# Patient Record
Sex: Female | Born: 1980 | Race: White | Hispanic: No | Smoking: Current every day smoker
Health system: Southern US, Community
[De-identification: ages and names within clinical notes are randomized; demographics above are authoritative.]

## PROBLEM LIST (undated history)

## (undated) DIAGNOSIS — Z8619 Personal history of other infectious and parasitic diseases: Secondary | ICD-10-CM

## (undated) DIAGNOSIS — N189 Chronic kidney disease, unspecified: Secondary | ICD-10-CM

## (undated) DIAGNOSIS — IMO0002 Reserved for concepts with insufficient information to code with codable children: Secondary | ICD-10-CM

## (undated) DIAGNOSIS — K589 Irritable bowel syndrome without diarrhea: Secondary | ICD-10-CM

## (undated) HISTORY — DX: Irritable bowel syndrome, unspecified: K58.9

## (undated) HISTORY — DX: Reserved for concepts with insufficient information to code with codable children: IMO0002

## (undated) HISTORY — PX: KIDNEY SURGERY: SHX687

## (undated) HISTORY — DX: Chronic kidney disease, unspecified: N18.9

## (undated) HISTORY — DX: Personal history of other infectious and parasitic diseases: Z86.19

---

## 1999-09-09 ENCOUNTER — Ambulatory Visit (HOSPITAL_COMMUNITY): Admission: RE | Admit: 1999-09-09 | Discharge: 1999-09-09 | Payer: Self-pay | Admitting: Obstetrics and Gynecology

## 2000-07-27 ENCOUNTER — Ambulatory Visit (HOSPITAL_COMMUNITY): Admission: RE | Admit: 2000-07-27 | Discharge: 2000-07-27 | Payer: Self-pay | Admitting: Urology

## 2004-05-06 ENCOUNTER — Inpatient Hospital Stay (HOSPITAL_COMMUNITY): Admission: AD | Admit: 2004-05-06 | Discharge: 2004-05-06 | Payer: Self-pay | Admitting: Obstetrics and Gynecology

## 2004-07-09 ENCOUNTER — Ambulatory Visit (HOSPITAL_COMMUNITY): Admission: RE | Admit: 2004-07-09 | Discharge: 2004-07-09 | Payer: Self-pay | Admitting: Obstetrics & Gynecology

## 2004-09-19 ENCOUNTER — Inpatient Hospital Stay (HOSPITAL_COMMUNITY): Admission: AD | Admit: 2004-09-19 | Discharge: 2004-09-19 | Payer: Self-pay | Admitting: Obstetrics and Gynecology

## 2004-09-20 ENCOUNTER — Inpatient Hospital Stay (HOSPITAL_COMMUNITY): Admission: AD | Admit: 2004-09-20 | Discharge: 2004-09-22 | Payer: Self-pay | Admitting: Obstetrics and Gynecology

## 2004-10-22 ENCOUNTER — Other Ambulatory Visit: Admission: RE | Admit: 2004-10-22 | Discharge: 2004-10-22 | Payer: Self-pay | Admitting: Obstetrics and Gynecology
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
## 2006-02-03 ENCOUNTER — Other Ambulatory Visit: Admission: RE | Admit: 2006-02-03 | Discharge: 2006-02-03 | Payer: Self-pay | Admitting: Obstetrics & Gynecology

## 2008-05-07 ENCOUNTER — Encounter: Admission: RE | Admit: 2008-05-07 | Discharge: 2008-05-07 | Payer: Self-pay | Admitting: Obstetrics & Gynecology

## 2008-12-05 HISTORY — PX: OTHER SURGICAL HISTORY: SHX169

## 2009-01-26 ENCOUNTER — Encounter: Admission: RE | Admit: 2009-01-26 | Discharge: 2009-01-26 | Payer: Self-pay | Admitting: Obstetrics and Gynecology

## 2010-12-26 ENCOUNTER — Encounter: Payer: Self-pay | Admitting: Obstetrics & Gynecology

## 2011-04-22 NOTE — Op Note (Signed)
[facility]  Patient:    Stephanie Petty, Stephanie Petty                   MRN: [id number] Proc. Date: 07/27/00 Adm. Date:  [id number] Disc. Date: [id number] Attending:  Laqueta Jean                           Operative Report  PREOPERATIVE DIAGNOSIS:  Left lower ureteral calculus.  POSTOPERATIVE DIAGNOSIS:  Left lower ureteral calculus.  OPERATION PERFORMED:  Cystourethroscopy, left retrograde pyelogram, balloon dilation of lower left ureter, ureteroscopy with basket extraction of left ureteral calculus, left double-J catheter.  SURGEON:  Sigmund I. Patsi Sears, M.D.  ANESTHESIA:  General.  PREPARATION:  After appropriate preanesthesia, the patient was brought to the operating room and placed on the operating table in a dorsal supine position where general anesthesia was introduced.  She was then placed in dorsal lithotomy position where the pubis was prepped with Betadine solution and draped in the usual fashion.  DESCRIPTION OF PROCEDURE:  Cystourethroscopy was accomplished and left retrograde pyelogram was accomplished, which showed a left lower ureteral calculus, imbedded in the ureter.  Balloon dilation was accomplished of the lower 4 cm of ureter, and ureteroscopy was accomplished with identification of fractured stone in the lower left ureter.  The stone was impacted and removed from the side wall of the ureter.  Following multiple ureteroscopies, Xylocaine jelly was placed in the ureter and double-J catheter was passed into the renal pelvis and coiled in the renal pelvis and the bladder.  The patient was given IV Toradol, awakened and taken to the recovery room in excellent condition. DD:  07/27/00 TD:  07/29/00 Job: [id number]

## 2011-06-29 LAB — ABO/RH

## 2011-06-29 LAB — ANTIBODY SCREEN: Antibody Screen: NEGATIVE

## 2011-06-29 LAB — HEPATITIS B SURFACE ANTIGEN: Hepatitis B Surface Ag: NEGATIVE

## 2011-06-29 LAB — HIV ANTIBODY (ROUTINE TESTING W REFLEX): HIV: NONREACTIVE

## 2011-06-29 LAB — GC/CHLAMYDIA PROBE AMP, GENITAL
Chlamydia: NEGATIVE
Gonorrhea: NEGATIVE

## 2011-12-06 NOTE — L&D Delivery Note (Signed)
Delivery Note  SVD viable female Apgars 8,9 over intact perineum.  Placenta delivered spontaneously intact with 3VC. good support and hemostasis noted and R/V exam confirms.  PH art was snet.  [place] cord blood was not done.  Mother and baby were doing well.  EBL 300  Candice Camp, MD

## 2012-01-18 LAB — STREP B DNA PROBE: GBS: POSITIVE

## 2012-02-08 ENCOUNTER — Encounter (HOSPITAL_COMMUNITY): Payer: Self-pay | Admitting: *Deleted

## 2012-02-08 ENCOUNTER — Telehealth (HOSPITAL_COMMUNITY): Payer: Self-pay | Admitting: *Deleted

## 2012-02-08 NOTE — Telephone Encounter (Signed)
Preadmission screen  

## 2012-02-10 ENCOUNTER — Inpatient Hospital Stay (HOSPITAL_COMMUNITY): Payer: BC Managed Care – PPO | Admitting: Anesthesiology

## 2012-02-10 ENCOUNTER — Inpatient Hospital Stay (HOSPITAL_COMMUNITY)
Admission: RE | Admit: 2012-02-10 | Discharge: 2012-02-12 | DRG: 373 | Disposition: A | Discharge status: Home / Self Care | Payer: BC Managed Care – PPO | Source: Ambulatory Visit | Attending: Obstetrics and Gynecology | Admitting: Obstetrics and Gynecology

## 2012-02-10 ENCOUNTER — Encounter (HOSPITAL_COMMUNITY): Payer: Self-pay | Admitting: Anesthesiology

## 2012-02-10 ENCOUNTER — Encounter (HOSPITAL_COMMUNITY): Payer: Self-pay

## 2012-02-10 DIAGNOSIS — Z22330 Carrier of Group B streptococcus: Secondary | ICD-10-CM

## 2012-02-10 DIAGNOSIS — O99892 Other specified diseases and conditions complicating childbirth: Principal | ICD-10-CM | POA: Diagnosis present

## 2012-02-10 LAB — CBC
Hemoglobin: 11.5 g/dL — ABNORMAL LOW (ref 12.0–15.0)
MCH: 27.2 pg (ref 26.0–34.0)
MCV: 84.6 fL (ref 78.0–100.0)
RBC: 4.23 MIL/uL (ref 3.87–5.11)

## 2012-02-10 LAB — RPR: RPR Ser Ql: NONREACTIVE

## 2012-02-10 MED ORDER — SENNOSIDES-DOCUSATE SODIUM 8.6-50 MG PO TABS
2.0000 | ORAL_TABLET | Freq: Every day | ORAL | Status: DC
  Administered 2012-02-11: 2 via ORAL

## 2012-02-10 MED ORDER — PHENYLEPHRINE 40 MCG/ML (10ML) SYRINGE FOR IV PUSH (FOR BLOOD PRESSURE SUPPORT): 80.0000 ug | PREFILLED_SYRINGE | INTRAVENOUS | Status: DC | PRN

## 2012-02-10 MED ORDER — LACTATED RINGERS IV SOLN
500.0000 mL | INTRAVENOUS | Status: DC | PRN
  Administered 2012-02-10: 1000 mL via INTRAVENOUS

## 2012-02-10 MED ORDER — FLEET ENEMA 7-19 GM/118ML RE ENEM: 1.0000 | ENEMA | RECTAL | Status: DC | PRN

## 2012-02-10 MED ORDER — IBUPROFEN 600 MG PO TABS: 600.0000 mg | ORAL_TABLET | Freq: Four times a day (QID) | ORAL | Status: DC | PRN

## 2012-02-10 MED ORDER — IBUPROFEN 600 MG PO TABS
600.0000 mg | ORAL_TABLET | Freq: Four times a day (QID) | ORAL | Status: DC
  Administered 2012-02-11 – 2012-02-12 (×4): 600 mg via ORAL
  Filled 2012-02-10 (×5): qty 1

## 2012-02-10 MED ORDER — ZOLPIDEM TARTRATE 10 MG PO TABS: 10.0000 mg | ORAL_TABLET | Freq: Every evening | ORAL | Status: DC | PRN

## 2012-02-10 MED ORDER — LACTATED RINGERS IV SOLN: 500.0000 mL | Freq: Once | INTRAVENOUS | Status: DC

## 2012-02-10 MED ORDER — FENTANYL 2.5 MCG/ML BUPIVACAINE 1/10 % EPIDURAL INFUSION (WH - ANES)
INTRAMUSCULAR | Status: DC | PRN
  Administered 2012-02-10: 14 mL/h via EPIDURAL

## 2012-02-10 MED ORDER — EPHEDRINE 5 MG/ML INJ: 10.0000 mg | INTRAVENOUS | Status: DC | PRN

## 2012-02-10 MED ORDER — MEDROXYPROGESTERONE ACETATE 150 MG/ML IM SUSP: 150.0000 mg | INTRAMUSCULAR | Status: DC | PRN

## 2012-02-10 MED ORDER — OXYTOCIN 20 UNITS IN LACTATED RINGERS INFUSION - SIMPLE
125.0000 mL/h | Freq: Once | INTRAVENOUS | Status: AC
  Administered 2012-02-10: 125 mL/h via INTRAVENOUS

## 2012-02-10 MED ORDER — OXYTOCIN BOLUS FROM INFUSION
500.0000 mL | Freq: Once | INTRAVENOUS | Status: DC
  Filled 2012-02-10: qty 500

## 2012-02-10 MED ORDER — CITRIC ACID-SODIUM CITRATE 334-500 MG/5ML PO SOLN: 30.0000 mL | ORAL | Status: DC | PRN

## 2012-02-10 MED ORDER — ONDANSETRON HCL 4 MG/2ML IJ SOLN: 4.0000 mg | INTRAMUSCULAR | Status: DC | PRN

## 2012-02-10 MED ORDER — DIPHENHYDRAMINE HCL 50 MG/ML IJ SOLN: 12.5000 mg | INTRAMUSCULAR | Status: DC | PRN

## 2012-02-10 MED ORDER — OXYTOCIN 20 UNITS IN LACTATED RINGERS INFUSION - SIMPLE
1.0000 m[IU]/min | INTRAVENOUS | Status: DC
  Administered 2012-02-10: 12 m[IU]/min via INTRAVENOUS
  Administered 2012-02-10: 1 m[IU]/min via INTRAVENOUS
  Filled 2012-02-10: qty 1000

## 2012-02-10 MED ORDER — ACETAMINOPHEN 325 MG PO TABS: 650.0000 mg | ORAL_TABLET | ORAL | Status: DC | PRN

## 2012-02-10 MED ORDER — FENTANYL 2.5 MCG/ML BUPIVACAINE 1/10 % EPIDURAL INFUSION (WH - ANES)
14.0000 mL/h | INTRAMUSCULAR | Status: DC
  Administered 2012-02-10: 14 mL/h via EPIDURAL
  Filled 2012-02-10 (×2): qty 60

## 2012-02-10 MED ORDER — TETANUS-DIPHTH-ACELL PERTUSSIS 5-2.5-18.5 LF-MCG/0.5 IM SUSP: 0.5000 mL | Freq: Once | INTRAMUSCULAR | Status: DC

## 2012-02-10 MED ORDER — OXYCODONE-ACETAMINOPHEN 5-325 MG PO TABS: 1.0000 | ORAL_TABLET | ORAL | Status: DC | PRN

## 2012-02-10 MED ORDER — TERBUTALINE SULFATE 1 MG/ML IJ SOLN: 0.2500 mg | Freq: Once | INTRAMUSCULAR | Status: DC | PRN

## 2012-02-10 MED ORDER — DIPHENHYDRAMINE HCL 25 MG PO CAPS: 25.0000 mg | ORAL_CAPSULE | Freq: Four times a day (QID) | ORAL | Status: DC | PRN

## 2012-02-10 MED ORDER — BUTORPHANOL TARTRATE 2 MG/ML IJ SOLN
1.0000 mg | INTRAMUSCULAR | Status: DC | PRN
  Filled 2012-02-10: qty 1

## 2012-02-10 MED ORDER — ONDANSETRON HCL 4 MG PO TABS: 4.0000 mg | ORAL_TABLET | ORAL | Status: DC | PRN

## 2012-02-10 MED ORDER — SODIUM CHLORIDE 0.9 % IV SOLN
2.0000 g | Freq: Four times a day (QID) | INTRAVENOUS | Status: DC
  Administered 2012-02-10 (×4): 2 g via INTRAVENOUS
  Filled 2012-02-10 (×4): qty 2000

## 2012-02-10 MED ORDER — BUTORPHANOL TARTRATE 2 MG/ML IJ SOLN
1.0000 mg | INTRAMUSCULAR | Status: DC | PRN
  Administered 2012-02-10: 1 mg via INTRAVENOUS

## 2012-02-10 MED ORDER — PHENYLEPHRINE 40 MCG/ML (10ML) SYRINGE FOR IV PUSH (FOR BLOOD PRESSURE SUPPORT)
80.0000 ug | PREFILLED_SYRINGE | INTRAVENOUS | Status: DC | PRN
  Filled 2012-02-10: qty 5

## 2012-02-10 MED ORDER — DIBUCAINE 1 % RE OINT: 1.0000 "application " | TOPICAL_OINTMENT | RECTAL | Status: DC | PRN

## 2012-02-10 MED ORDER — EPHEDRINE 5 MG/ML INJ
10.0000 mg | INTRAVENOUS | Status: DC | PRN
  Filled 2012-02-10: qty 4

## 2012-02-10 MED ORDER — FENTANYL 2.5 MCG/ML BUPIVACAINE 1/10 % EPIDURAL INFUSION (WH - ANES): 14.0000 mL/h | INTRAMUSCULAR | Status: DC

## 2012-02-10 MED ORDER — ZOLPIDEM TARTRATE 5 MG PO TABS: 5.0000 mg | ORAL_TABLET | Freq: Every evening | ORAL | Status: DC | PRN

## 2012-02-10 MED ORDER — SODIUM BICARBONATE 8.4 % IV SOLN
INTRAVENOUS | Status: DC | PRN
  Administered 2012-02-10: 4 mL via EPIDURAL

## 2012-02-10 MED ORDER — LIDOCAINE HCL (PF) 1 % IJ SOLN
30.0000 mL | INTRAMUSCULAR | Status: DC | PRN
  Filled 2012-02-10: qty 30

## 2012-02-10 MED ORDER — ONDANSETRON HCL 4 MG/2ML IJ SOLN: 4.0000 mg | Freq: Four times a day (QID) | INTRAMUSCULAR | Status: DC | PRN

## 2012-02-10 MED ORDER — PRENATAL MULTIVITAMIN CH: 1.0000 | ORAL_TABLET | Freq: Every day | ORAL | Status: DC

## 2012-02-10 MED ORDER — PROMETHAZINE HCL 25 MG/ML IJ SOLN: 12.5000 mg | Freq: Four times a day (QID) | INTRAMUSCULAR | Status: DC | PRN

## 2012-02-10 MED ORDER — LANOLIN HYDROUS EX OINT: TOPICAL_OINTMENT | CUTANEOUS | Status: DC | PRN

## 2012-02-10 MED ORDER — MEASLES, MUMPS & RUBELLA VAC ~~LOC~~ INJ: 0.5000 mL | INJECTION | Freq: Once | SUBCUTANEOUS | Status: DC

## 2012-02-10 MED ORDER — BENZOCAINE-MENTHOL 20-0.5 % EX AERO: 1.0000 "application " | INHALATION_SPRAY | CUTANEOUS | Status: DC | PRN

## 2012-02-10 MED ORDER — PRENATAL MULTIVITAMIN CH
1.0000 | ORAL_TABLET | Freq: Every day | ORAL | Status: DC
  Administered 2012-02-11 – 2012-02-12 (×2): 1 via ORAL
  Filled 2012-02-10 (×2): qty 1

## 2012-02-10 MED ORDER — LACTATED RINGERS IV SOLN
INTRAVENOUS | Status: DC
  Administered 2012-02-10 (×2): 999 mL via INTRAVENOUS

## 2012-02-10 MED ORDER — WITCH HAZEL-GLYCERIN EX PADS: 1.0000 "application " | MEDICATED_PAD | CUTANEOUS | Status: DC | PRN

## 2012-02-10 MED ORDER — SIMETHICONE 80 MG PO CHEW: 80.0000 mg | CHEWABLE_TABLET | ORAL | Status: DC | PRN

## 2012-02-10 NOTE — Anesthesia Procedure Notes (Signed)

## 2012-02-10 NOTE — Progress Notes (Signed)
Pt up to br using steady, pt pericare done and taught, gown changed,  Pt ambulates to wc.  Pt transferred to [facility].  Report given to mother baby Public affairs consultant

## 2012-02-10 NOTE — Anesthesia Preprocedure Evaluation (Signed)
Anesthesia Evaluation  Patient identified by MRN, date of birth, ID band Patient awake    Reviewed: Allergy & Precautions, H&P , Patient's Chart, lab work & pertinent test results  Airway Mallampati: II  TM Distance: >3 FB Neck ROM: full    Dental  (+) Teeth Intact   Pulmonary  breath sounds clear to auscultation        Cardiovascular Rhythm:regular Rate:Normal     Neuro/Psych    GI/Hepatic GERD-  ,  Endo/Other  Morbid obesity  Renal/GU      Musculoskeletal   Abdominal   Peds  Hematology   Anesthesia Other Findings       Reproductive/Obstetrics (+) Pregnancy                             Anesthesia Physical Anesthesia Plan  ASA: III  Anesthesia Plan: Epidural   Post-op Pain Management:    Induction:   Airway Management Planned:   Additional Equipment:   Intra-op Plan:   Post-operative Plan:   Informed Consent: I have reviewed the patients History and Physical, chart, labs and discussed the procedure including the risks, benefits and alternatives for the proposed anesthesia with the patient or authorized representative who has indicated his/her understanding and acceptance.   Dental Advisory Given  Plan Discussed with:   Anesthesia Plan Comments: (Labs checked- platelets confirmed with RN in room. Fetal heart tracing, per RN, reported to be stable enough for sitting procedure. Discussed epidural, and patient consents to the procedure:  included risk of possible headache,backache, failed block, allergic reaction, and nerve injury. This patient was asked if she had any questions or concerns before the procedure started.)        Anesthesia Quick Evaluation  
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
## 2012-02-11 LAB — CBC
Hemoglobin: 10.9 g/dL — ABNORMAL LOW (ref 12.0–15.0)
MCH: 27.7 pg (ref 26.0–34.0)
RBC: 3.94 MIL/uL (ref 3.87–5.11)
WBC: 15 10*3/uL — ABNORMAL HIGH (ref 4.0–10.5)

## 2012-02-11 MED ORDER — RHO D IMMUNE GLOBULIN 1500 UNIT/2ML IJ SOLN
300.0000 ug | Freq: Once | INTRAMUSCULAR | Status: AC
  Administered 2012-02-11: 300 ug via INTRAMUSCULAR
  Filled 2012-02-11: qty 2

## 2012-02-11 NOTE — Progress Notes (Signed)
Post Partum Day 1 Subjective: no complaints, up ad lib, voiding and tolerating PO  Objective: Blood pressure 103/66, pulse 92, temperature 98.2 F (36.8 C), temperature source Oral, resp. rate 18, height 5\' 3"  (1.6 m), weight 94.348 kg (208 lb), last menstrual period 05/04/2011, SpO2 100.00%, unknown if currently breastfeeding.  Physical Exam:  General: alert, cooperative, appears stated age and no distress Lochia: appropriate Uterine Fundus: firm Incision:  DVT Evaluation: No evidence of DVT seen on physical exam.   Basename 02/11/12 0542 02/10/12 0805  HGB 10.9* 11.5*  HCT 33.4* 35.8*    Assessment/Plan: Doing well   LOS: 1 day   Dakari Stabler C 02/11/2012, 8:55 AM

## 2012-02-11 NOTE — Anesthesia Postprocedure Evaluation (Signed)
  Anesthesia Post-op Note  Patient: Stephanie Petty  Procedure(s) Performed: * No procedures listed *  Patient Location: PACU and Mother/Baby  Anesthesia Type: Epidural  Level of Consciousness: awake, alert  and oriented  Airway and Oxygen Therapy: Patient Spontanous Breathing  Post-op Pain: none  Post-op Assessment: Post-op Vital signs reviewed and Patient's Cardiovascular Status Stable  Post-op Vital Signs: Reviewed and stable  Complications: No apparent anesthesia complications

## 2012-02-12 LAB — RH IG WORKUP (INCLUDES ABO/RH)
ABO/RH(D): A NEG
Antibody Screen: POSITIVE
Fetal Screen: NEGATIVE
Gestational Age(Wks): 39.3

## 2012-02-12 NOTE — Progress Notes (Signed)
Post Partum Day 2 Subjective: no complaints, up ad lib, voiding and tolerating PO  Objective: Blood pressure 114/80, pulse 84, temperature 97.6 F (36.4 C), temperature source Oral, resp. rate 20, height 5\' 3"  (1.6 m), weight 94.348 kg (208 lb), last menstrual period 05/04/2011, SpO2 98.00%, unknown if currently breastfeeding.  Physical Exam:  General: alert, cooperative and no distress Lochia: appropriate Uterine Fundus: firm Incision:  DVT Evaluation: No evidence of DVT seen on physical exam.   Basename 02/11/12 0542 02/10/12 0805  HGB 10.9* 11.5*  HCT 33.4* 35.8*    Assessment/Plan: Discharge home and Breastfeeding   LOS: 2 days   Dyasia Firestine C 02/12/2012, 9:37 AM

## 2012-02-13 NOTE — Discharge Summary (Signed)
Obstetric Discharge Summary Reason for Admission: induction of labor Prenatal Procedures: ultrasound Intrapartum Procedures: spontaneous vaginal delivery Postpartum Procedures: none Complications-Operative and Postpartum: none Hemoglobin  Date Value Range Status  02/11/2012 10.9* 12.0-15.0 (g/dL) Final     HCT  Date Value Range Status  02/11/2012 33.4* 36.0-46.0 (%) Final    Discharge Diagnoses: Term Pregnancy-delivered  Discharge Information: Date: 02/13/2012 Activity: pelvic rest Diet: routine Medications: None and Ibuprofen Condition: stable Instructions: refer to practice specific booklet Discharge to: home   Newborn Data: Live born female  Birth Weight: 7 lb 7.2 oz (3380 g) APGAR: 8, 9  Home with mother.  Stephanie Petty G 02/13/2012, 8:08 AM

## 2012-02-14 ENCOUNTER — Inpatient Hospital Stay (HOSPITAL_COMMUNITY): Admission: AD | Admit: 2012-02-14 | Payer: Self-pay | Source: Ambulatory Visit | Admitting: Obstetrics and Gynecology

## 2014-10-06 ENCOUNTER — Encounter (HOSPITAL_COMMUNITY): Payer: Self-pay
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
## 2017-06-04 ENCOUNTER — Inpatient Hospital Stay (HOSPITAL_COMMUNITY)
Admission: AD | Admit: 2017-06-04 | Discharge: 2017-06-11 | DRG: 885 | Disposition: A | Discharge status: Home / Self Care | Payer: BLUE CROSS/BLUE SHIELD | Source: Other Acute Inpatient Hospital | Attending: Psychiatry | Admitting: Psychiatry

## 2017-06-04 ENCOUNTER — Encounter (HOSPITAL_COMMUNITY): Payer: Self-pay | Admitting: *Deleted

## 2017-06-04 DIAGNOSIS — F4010 Social phobia, unspecified: Secondary | ICD-10-CM | POA: Diagnosis present

## 2017-06-04 DIAGNOSIS — Z62810 Personal history of physical and sexual abuse in childhood: Secondary | ICD-10-CM | POA: Diagnosis present

## 2017-06-04 DIAGNOSIS — F332 Major depressive disorder, recurrent severe without psychotic features: Secondary | ICD-10-CM

## 2017-06-04 DIAGNOSIS — R4587 Impulsiveness: Secondary | ICD-10-CM | POA: Diagnosis present

## 2017-06-04 DIAGNOSIS — T43215A Adverse effect of selective serotonin and norepinephrine reuptake inhibitors, initial encounter: Secondary | ICD-10-CM | POA: Diagnosis present

## 2017-06-04 DIAGNOSIS — Z818 Family history of other mental and behavioral disorders: Secondary | ICD-10-CM | POA: Diagnosis not present

## 2017-06-04 DIAGNOSIS — F064 Anxiety disorder due to known physiological condition: Secondary | ICD-10-CM | POA: Diagnosis present

## 2017-06-04 DIAGNOSIS — G4700 Insomnia, unspecified: Secondary | ICD-10-CM | POA: Diagnosis not present

## 2017-06-04 DIAGNOSIS — R45851 Suicidal ideations: Secondary | ICD-10-CM | POA: Diagnosis present

## 2017-06-04 DIAGNOSIS — F333 Major depressive disorder, recurrent, severe with psychotic symptoms: Secondary | ICD-10-CM | POA: Diagnosis present

## 2017-06-04 DIAGNOSIS — G251 Drug-induced tremor: Secondary | ICD-10-CM | POA: Diagnosis present

## 2017-06-04 DIAGNOSIS — F4310 Post-traumatic stress disorder, unspecified: Secondary | ICD-10-CM | POA: Diagnosis present

## 2017-06-04 DIAGNOSIS — F329 Major depressive disorder, single episode, unspecified: Secondary | ICD-10-CM | POA: Diagnosis present

## 2017-06-04 MED ORDER — HYDROXYZINE HCL 25 MG PO TABS
25.0000 mg | ORAL_TABLET | Freq: Three times a day (TID) | ORAL | Status: DC | PRN
  Administered 2017-06-04 – 2017-06-06 (×3): 25 mg via ORAL
  Filled 2017-06-04 (×2): qty 1

## 2017-06-04 MED ORDER — ENSURE ENLIVE PO LIQD
237.0000 mL | Freq: Two times a day (BID) | ORAL | Status: DC
  Administered 2017-06-04 – 2017-06-06 (×3): 237 mL via ORAL

## 2017-06-04 MED ORDER — MAGNESIUM HYDROXIDE 400 MG/5ML PO SUSP: 30.0000 mL | Freq: Every day | ORAL | Status: DC | PRN

## 2017-06-04 MED ORDER — TRAZODONE HCL 50 MG PO TABS
50.0000 mg | ORAL_TABLET | Freq: Every evening | ORAL | Status: DC | PRN
  Filled 2017-06-04 (×4): qty 1

## 2017-06-04 MED ORDER — ALUM & MAG HYDROXIDE-SIMETH 200-200-20 MG/5ML PO SUSP: 30.0000 mL | ORAL | Status: DC | PRN

## 2017-06-04 MED ORDER — HYDROXYZINE HCL 25 MG PO TABS
ORAL_TABLET | ORAL | Status: AC
  Filled 2017-06-04: qty 1

## 2017-06-04 MED ORDER — ACETAMINOPHEN 325 MG PO TABS: 650.0000 mg | ORAL_TABLET | Freq: Four times a day (QID) | ORAL | Status: DC | PRN

## 2017-06-04 MED ORDER — VENLAFAXINE HCL ER 37.5 MG PO CP24
37.5000 mg | ORAL_CAPSULE | Freq: Every day | ORAL | Status: DC
  Administered 2017-06-05: 37.5 mg via ORAL
  Filled 2017-06-04 (×3): qty 1

## 2017-06-04 NOTE — [facility] Group Notes (Signed)
Patient invited to attend Nursing Education group however elected not to attend. 
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
## 2017-06-04 NOTE — [facility] Suicide Risk Assessment (Signed)
[facility]   Nursing information obtained from:    Demographic factors:    Current Mental Status:    Loss Factors:    Historical Factors:    Risk Reduction Factors:     Total Time spent with patient: 1.5 hours Principal Problem: MDD (major depressive disorder), recurrent episode (HCC) Diagnosis:   Patient Active Problem List   Diagnosis Date Noted  . MDD (major depressive disorder), recurrent episode (HCC) [F33.9] 06/04/2017   Subjective Data: alert, oriented, anxious.   Continued Clinical Symptoms:  Alcohol Use Disorder Identification Test Final Score (AUDIT): 0 The "Alcohol Use Disorders Identification Test", Guidelines for Use in Primary Care, Second Edition.  World Science writerHealth Organization [facility](WHO). Score between 0-7:  no or low risk or alcohol related problems. Score between 8-15:  moderate risk of alcohol related problems. Score between 16-19:  high risk of alcohol related problems. Score 20 or above:  warrants further diagnostic evaluation for alcohol dependence and treatment.   CLINICAL FACTORS:   Depression:   Hopelessness Impulsivity Unstable or Poor Therapeutic Relationship Previous Psychiatric Diagnoses and Treatments   Musculoskeletal: Strength & Muscle Tone: within normal limits Gait & Station: normal Patient leans: no lean  Psychiatric Specialty Exam: Physical Exam  Review of Systems  Cardiovascular: Negative for chest pain.  Neurological: Positive for dizziness.  Psychiatric/Behavioral: Positive for depression. The patient is nervous/anxious.     Blood pressure 102/69, pulse 69, temperature 98.9 F (37.2 C), temperature source Oral, resp. rate 16, height 5\' 4"  (1.626 m), weight 70.3 kg (155 lb), SpO2 100 %, unknown if currently breastfeeding.Body mass index is 26.61 kg/m.  General Appearance: Casual  Eye Contact:  Fair  Speech:  Slow  Volume:  Decreased  Mood:  Depressed and Dysphoric  Affect:  Constricted and Depressed   Thought Process:  Goal Directed  Orientation:  Full (Time, Place, and Person)  Thought Content:  Rumination  Suicidal Thoughts:  No  Homicidal Thoughts:  No  Memory:  Immediate;   Fair Recent;   Fair  Judgement:  Fair  Insight:  Fair  Psychomotor Activity:  Decreased  Concentration:  Concentration: Fair and Attention Span: Fair  Recall:  [employer]Fair  Fund of Knowledge:  Fair  Language:  Fair  Akathisia:  Negative  Handed:  Right  AIMS (if indicated):     Assets:  Desire for Improvement  ADL's:  Intact  Cognition:  WNL  Sleep:         COGNITIVE FEATURES THAT CONTRIBUTE TO RISK:  Closed-mindedness    SUICIDE RISK:   Moderate:  Frequent suicidal ideation with limited intensity, and duration, some specificity in terms of plans, no associated intent, good self-control, limited dysphoria/symptomatology, some risk factors present, and identifiable protective factors, including available and accessible social support.  PLAN OF CARE: Admit for stabilization. Medication management. Safety and depression  I certify that inpatient services furnished can reasonably be expected to improve the patient's condition.   Thresa RossAKHTAR, Ilija Maxim, MD 06/04/2017, 12:15 PM

## 2017-06-04 NOTE — [facility] Counselor (Signed)
Attempted to meet with patient to do Psychosocial Assessment but she was soundly asleep.  Social work Haematologiststaff will attempt Surveyor, mineralsagainst tomorrow.  Ambrose MantleMareida Grossman-Orr, LCSW 06/04/2017, 4:23 PM
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
## 2017-06-04 NOTE — Progress Notes (Signed)
  Adult Psychoeducational Group Note  Date:  06/04/2017 Time:  10:50 PM  Group Topic/Focus:  Wrap-Up Group:   The focus of this group is to help patients review their daily goal of treatment and discuss progress on daily workbooks.  Participation Level:  Did Not Attend  Additional Comments: Pt did not attend group.  Karleen HampshireFox, Carmeron Heady Brittini 06/04/2017, 10:50 PM

## 2017-06-04 NOTE — Progress Notes (Signed)
Patient has continued to rest throughout the afternoon however at times is observed to be restless in bed. Remains sad, anxious. Reports missing her children but verbalizes awareness she needs to focus on herself right now and get well for them. States she does not wish for them to visit even though that was offered by this Clinical research associatewriter (per unit protocol/process.) Patient's verbal contract for safety remains in place. Will continue to monitor frequently, make verbal contact often.
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
## 2017-06-05 DIAGNOSIS — F333 Major depressive disorder, recurrent, severe with psychotic symptoms: Principal | ICD-10-CM

## 2017-06-05 LAB — PREGNANCY, URINE: Preg Test, Ur: NEGATIVE

## 2017-06-05 MED ORDER — OLANZAPINE 2.5 MG PO TABS
2.5000 mg | ORAL_TABLET | Freq: Every day | ORAL | Status: DC
  Administered 2017-06-06: 2.5 mg via ORAL
  Filled 2017-06-05 (×4): qty 1

## 2017-06-05 MED ORDER — TRAZODONE HCL 50 MG PO TABS
50.0000 mg | ORAL_TABLET | Freq: Every evening | ORAL | Status: DC | PRN
  Filled 2017-06-05: qty 1

## 2017-06-05 MED ORDER — LAMOTRIGINE 25 MG PO TABS
25.0000 mg | ORAL_TABLET | Freq: Every day | ORAL | Status: DC
  Administered 2017-06-06 – 2017-06-07 (×2): 25 mg via ORAL
  Filled 2017-06-05 (×3): qty 1

## 2017-06-05 NOTE — Progress Notes (Signed)
D: Pt presents with a flat affect and depressed/anxious mood. Pt rates depression 5/10. Anxiety 3/10. Pt denies SI. Pt reports fair sleep. Pt stated that she feels restless and tired this morning. Pt would like to hold off on taking Effexor until she speaks with MD about medications. Dr. Elna BreslowEappen made aware. Pt stated that the last time she took an antidepressant "Lexarpo" that it sent her off the deep end.  A: Medications reviewed with pt. Medications administered as ordered per MD. Verbal support provided. Pt encouraged to attend groups. 15 minute checks performed for safety. R: Pt receptive to tx.

## 2017-06-05 NOTE — Progress Notes (Signed)
Pt reports feeling increasingly anxious and paranoid after taking Effexor for the first time today. Pt had to be brought back from the cafeteria due to increased SI with no intent or plan and anxiety. Writer offered to have pt Vistaril increased to 50 mg for anxiety. Pt declined and stated that she use to take Xanax for anxiety. Pt verbally contracts for safety and stated that she would like to rest in her room.

## 2017-06-05 NOTE — [facility] Group Notes (Signed)
[facility] LCSW Group Therapy  06/05/2017 1:15pm  Type of Therapy: Group Therapy   Topic: Overcoming Obstacles  Participation Level: Active  Participation Quality: Appropriate   Affect: Appropriate  Cognitive: Appropriate and Oriented  Insight: Developing/Improving and Improving  Engagement in Therapy: Improving  Modes of Intervention: Discussion, Exploration, Problem-solving and Support  Description of Group:  In this group patients will be encouraged to explore what they see as obstacles to their own wellness and recovery. They will be guided to discuss their thoughts, feelings, and behaviors related to these obstacles. The group will process together ways to cope with barriers, with attention given to specific choices patients can make. Each patient will be challenged to identify changes they are motivated to make in order to overcome their obstacles. This group will be process-oriented, with patients participating in exploration of their own experiences as well as giving and receiving support and challenge from other group members.  Summary of Patient Progress:  Pt reports that her biggest obstacle is that she is unsure what is causing her depression. Pt states that she has always been "the mom that can do it all". She would work 10 hour shifts and then come home and take care of her kids. Pt states that is hoping to get help to manage her symptoms.  Therapeutic Modalities:  Cognitive Behavioral Therapy Solution Focused Therapy Motivational Interviewing Relapse Prevention Therapy  Jonathon JordanLynn B Keiron Iodice, MSW, Theresia MajorsLCSWA [phone]

## 2017-06-05 NOTE — Progress Notes (Signed)
Psychoeducational Group Note  Date:  06/05/2017 Time:  1355  Group Topic/Focus:  Wellness Toolbox:   The focus of this group is to discuss various aspects of wellness, balancing those aspects and exploring ways to increase the ability to experience wellness.  Patients will create a wellness toolbox for use upon discharge.  Participation Level: Did Not Attend  Participation Quality:  Not Applicable  Affect:  Not Applicable  Cognitive:  Not Applicable  Insight:  Not Applicable  Engagement in Group: Not Applicable  Additional Comments:  Pt did not attend group this morning due to sleeping. Pt was asked and refused.   Dontavion Noxon E 06/05/2017, 1:55 PM

## 2017-06-05 NOTE — Progress Notes (Signed)
Stephanie Petty. Stephanie Petty had been up and visible in milieu this evening, did not attend or participate in evening group activity. Stephanie Petty had appeared anxious this evening and spoke briefly about the need to be in the hospital at this time. Stephanie Petty chose not to take evening sleep medication and reported that she would be ok without it but would come and get it if she needed. Stephanie Petty also did not verbalize any complaints of pain and was seen interacting appropriately with peers in milieu. A. Support and encouragement provided. R. Safety maintained, will continue to monitor.
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
## 2017-06-05 NOTE — Progress Notes (Signed)
Specimen cup given to pt for pregnancy test. Pt encouraged to return specimen as soon as possible.

## 2017-06-05 NOTE — Progress Notes (Signed)
NUTRITION ASSESSMENT  Pt identified as at risk on the Malnutrition Screen Tool  INTERVENTION: 1. Educated patient on the importance of nutrition and encouraged intake of food and beverages. 2. Discussed weight goals. 3. Supplements: continue Ensure Enlive po BID, each supplement provides 350 kcal and 20 grams of protein  NUTRITION DIAGNOSIS: Unintentional weight loss related to sub-optimal intake as evidenced by pt report.   Goal: Pt to meet >/= 90% of their estimated nutrition needs.  Monitor:  PO intake  Assessment:  Pt admitted for depression and anxiety with panic attacks. Psychiatry note from 7/1 at 11:30 AM states: Patient presents with anxiety, reported panic attacks and depressive symptoms - crying spells, anhedonia, poor concentration, poor appetite resulting in 20 lb weight loss, irritability racing and intrusive thoughts.   No recent weight hx available in the chart. Per reported weight loss of 20 lbs, pt has lost 11% body weight in unknown time frame. Ensure Shiela Mayernlive has already been ordered BID. Continue to encourage PO intakes of meals, supplements, and snacks.   36 y.o. female  Height: Ht Readings from Last 1 Encounters:  06/04/17 5\' 4"  (1.626 m)    Weight: Wt Readings from Last 1 Encounters:  06/04/17 155 lb (70.3 kg)    Weight Hx: Wt Readings from Last 10 Encounters:  06/04/17 155 lb (70.3 kg)  02/10/12 208 lb (94.3 kg)    BMI:  Body mass index is 26.61 kg/m. Pt meets criteria for overweight based on current BMI.  Estimated Nutritional Needs: Kcal: 25-30 kcal/kg Protein: > 1 gram protein/kg Fluid: 1 ml/kcal  Diet Order:   Pt is also offered choice of unit snacks mid-morning and mid-afternoon.  Pt is eating as desired.   Lab results and medications reviewed.     Trenton GammonJessica Jalecia Leon, MS, RD, LDN, [facility] Inpatient Clinical Dietitian Pager # [phone] After hours/weekend pager # [phone]

## 2017-06-06 LAB — LIPID PANEL
CHOLESTEROL: 163 mg/dL (ref 0–200)
HDL: 39 mg/dL — ABNORMAL LOW (ref >40)
LDL CALC: 104 mg/dL — AB (ref 0–99)
Total CHOL/HDL Ratio: 4.2 RATIO
Triglycerides: 98 mg/dL (ref <150)
VLDL: 20 mg/dL (ref 0–40)

## 2017-06-06 LAB — TSH: TSH: 2.289 u[IU]/mL (ref 0.350–4.500)

## 2017-06-06 NOTE — Progress Notes (Signed)
[facility] Group Notes:  (Nursing/MHT/Case Management/Adjunct)  Date:  06/06/2017  Time:  0930 Type of Therapy:  Nurse Education  Participation Level:  Did Not Attend  Participation Quality:    Affect:    Cognitive:    Insight:    Engagement in Group:    Modes of Intervention:    Summary of Progress/Problems:  Beatrix ShipperWright, Stephanie Petty 06/06/2017, 12:52 PM
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
## 2017-06-06 NOTE — Progress Notes (Signed)
D:Pt is anxious on the unit. Pt says that she feels hot/cold and like she is disassociating. Pt reports that she is depressed with passive si thoughts. She had a similar experience after stopping her birth control and then having a baby. It took her six months to start feeling better.   A:Offered support, encouragement and 15 minute checks. Gave medication as ordered. R:Pt contracts with staff for safety. Safety maintained on the unit.

## 2017-06-06 NOTE — Progress Notes (Signed)
Adult Psychoeducational Group Note  Date:  06/06/2017 Time:  5:15 AM  Group Topic/Focus:  Wrap-Up Group:   The focus of this group is to help patients review their daily goal of treatment and discuss progress on daily workbooks.  Participation Level:  Active  Participation Quality:  Appropriate, Attentive, Sharing and Supportive  Affect:  Appropriate  Cognitive:  Appropriate  Insight: Appropriate and Good  Engagement in Group:  Developing/Improving and Engaged  Modes of Intervention:  Discussion, Education, Socialization and Support  Additional Comments:  Pt shared during group that she did not sleep all and she was able to be interactive and come to groups today.  Karleen HampshireFox, Ajani Schnieders Brittini 06/06/2017, 5:15 AM

## 2017-06-06 NOTE — Progress Notes (Signed)
Truddie Crumble. Ysela had been up and visible in milieu this evening, attended and participated in evening group activity. Morrie Sheldonshley spoke about being hesitant to take her bedtime medication as she reports she took her am medication and spoke about how she was feeling suicidal a couple hours after taking them. She also spoke about how she is wanting to get some blood work done while here to rule out any hormonal changes that may be causing her problems. Morrie Sheldonshley was provided with information about medication she was suppose to receive this evening and was instructed that lab work had been ordered for in the morning. Morrie Sheldonshley did not receive bedtime dose of medication. A. Support provided, medication education given. R. Safety maintained, will continue to monitor.

## 2017-06-06 NOTE — Progress Notes (Deleted)
Recreation Therapy Notes  Animal-Assisted Activity (AAA) Program Checklist/Progress Notes Patient Eligibility Criteria Checklist & Daily Group note for Rec TxIntervention  Date: 07.03.2018 Time: 3:00pm Location: [address] Dayroom    AAA/T Program Assumption of Risk Form signed by Patient/ or Parent Legal Guardian Yes  Patient is free of allergies or sever asthma Yes  Patient reports no fear of animals Yes  Patient reports no history of cruelty to animals Yes  Patient understands his/her participation is voluntary Yes  Behavioral Response: Did not attend.   Tytus Strahle L Meldrick Buttery, LRT/CTRS        Reyann Troop L 06/06/2017 3:14 PM 

## 2017-06-06 NOTE — [facility] Group Notes (Signed)
[facility] LCSW Group Therapy 06/06/2017 1:15 PM  Type of Therapy: Group Therapy- Feelings about Diagnosis  Participation Level: Active   Participation Quality:  Appropriate  Affect:  Appropriate  Cognitive: Alert and Oriented   Insight:  Developing   Engagement in Therapy: Developing/Improving and Engaged   Modes of Intervention: Clarification, Confrontation, Discussion, Education, Exploration, Limit-setting, Orientation, Problem-solving, Rapport Building, Dance movement psychotherapisteality Testing, Socialization and Support  Description of Group:   This group will allow patients to explore their thoughts and feelings about diagnoses they have received. Patients will be guided to explore their level of understanding and acceptance of these diagnoses. Facilitator will encourage patients to process their thoughts and feelings about the reactions of others to their diagnosis, and will guide patients in identifying ways to discuss their diagnosis with significant others in their lives. This group will be process-oriented, with patients participating in exploration of their own experiences as well as giving and receiving support and challenge from other group members.  Summary of Progress/Problems:  Pt states that she is frustrated with her diagnosis because she was doing so well just a few months ago. Pt states that she is unable to identify a specific trigger that caused this and that is what is so frustrating.   Therapeutic Modalities:   Cognitive Behavioral Therapy Solution Focused Therapy Motivational Interviewing Relapse Prevention Therapy  Stephanie Petty, MSW, LCSW 06/06/2017 4:56 PM
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
## 2017-06-06 NOTE — Progress Notes (Signed)
Adult Psychoeducational Group Note  Date:  06/06/2017 Time:  10:57 PM  Group Topic/Focus:  Wrap-Up Group:   The focus of this group is to help patients review their daily goal of treatment and discuss progress on daily workbooks.  Participation Level:  Active  Participation Quality:  Appropriate  Affect:  Appropriate  Cognitive:  Alert  Insight: Appropriate  Engagement in Group:  Engaged  Modes of Intervention:  Discussion  Additional Comments: Patient's goal for today was to not have suicidal thoughts.   Vicy Medico L Fedora Knisely 06/06/2017, 10:57 PM
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
## 2017-06-06 NOTE — Progress Notes (Signed)
Patient ID: Stephanie Petty, female   DOB: 12/08/1980, 36 y.o.   MRN: [id number]  Pt currently presents with an anxious affect and behavior. Reports increased anxiety about "disassociative episodes." Pt states "There are times when I will be talking to someone and then start asking (existential questions) about everything." Requests to speak with MD about potential side effects of new medication, Zyprexa, tomorrow. Pt reports good sleep with current medication regimen.   Pt provided with medications per providers orders. Pt's labs and vitals were monitored throughout the night. Pt given a 1:1 about emotional and mental status. Pt supported and encouraged to express concerns and questions. Pt educated on medications and their side effects. Also educated on depression diagnosis.   Pt's safety ensured with 15 minute and environmental checks. Pt currently denies SI/HI and A/V hallucinations. Pt verbally agrees to seek staff if SI/HI or A/VH occurs and to consult with staff before acting on any harmful thoughts. Protective factor for patient is her two daughters. Reports she has no other support systems currently. Will continue POC.

## 2017-06-06 NOTE — Plan of Care (Signed)
Problem: Activity: Goal: Interest or engagement in activities will improve Outcome: Not Progressing Pt did not attend morning group and has difficulty focusing. She has paranoid behaviors looking around saying that she is fixated on numbers.

## 2017-06-06 NOTE — Progress Notes (Signed)
Recreation Therapy Notes  Animal-Assisted Activity (AAA) Program Checklist/Progress Notes Patient Eligibility Criteria Checklist & Daily Group note for Rec TxIntervention  Date: 07.03.2018 Time: 3:00pm [address]   AAA/T Program Assumption of Risk Form signed by Patient/ or Parent Legal Guardian Yes  Patient is free of allergies or sever asthma Yes  Patient reports no fear of animals Yes  Patient reports no history of cruelty to animals Yes  Patient understands his/her participation is voluntary Yes  Patient washes hands before animal contact Yes  Patient washes hands after animal contact Yes  Behavioral Response: Appropriate   Education:Hand Washing, Appropriate Animal Interaction   Education Outcome: Acknowledges education.   Clinical Observations/Feedback: Patient attended session and interacted appropriately with therapy dog and peers. Marykay Lex.   Assyria Morreale L Yehia Mcbain, LRT/CTRS          Jearl KlinefelterBlanchfield, Love Milbourne L 06/06/2017 3:58 PM

## 2017-06-07 DIAGNOSIS — F333 Major depressive disorder, recurrent, severe with psychotic symptoms: Secondary | ICD-10-CM | POA: Diagnosis present

## 2017-06-07 LAB — PROLACTIN: Prolactin: 15.1 ng/mL (ref 4.8–23.3)

## 2017-06-07 LAB — HEMOGLOBIN A1C
Hgb A1c MFr Bld: 5 % (ref 4.8–5.6)
Mean Plasma Glucose: 97 mg/dL

## 2017-06-07 MED ORDER — OLANZAPINE 2.5 MG PO TABS
2.5000 mg | ORAL_TABLET | Freq: Two times a day (BID) | ORAL | Status: DC
  Filled 2017-06-07 (×3): qty 1

## 2017-06-07 MED ORDER — LAMOTRIGINE 25 MG PO TABS
50.0000 mg | ORAL_TABLET | Freq: Every day | ORAL | Status: DC
  Administered 2017-06-08: 50 mg via ORAL
  Filled 2017-06-07 (×2): qty 2

## 2017-06-07 MED ORDER — CITALOPRAM HYDROBROMIDE 10 MG PO TABS
10.0000 mg | ORAL_TABLET | Freq: Every day | ORAL | Status: DC
  Administered 2017-06-07 – 2017-06-11 (×5): 10 mg via ORAL
  Filled 2017-06-07 (×9): qty 1

## 2017-06-07 MED ORDER — OLANZAPINE 2.5 MG PO TABS
2.5000 mg | ORAL_TABLET | Freq: Two times a day (BID) | ORAL | Status: DC
  Administered 2017-06-08 – 2017-06-09 (×3): 2.5 mg via ORAL
  Filled 2017-06-07 (×5): qty 1

## 2017-06-07 MED ORDER — OLANZAPINE 5 MG PO TBDP
5.0000 mg | ORAL_TABLET | Freq: Every day | ORAL | Status: DC
  Filled 2017-06-07: qty 1

## 2017-06-07 MED ORDER — OLANZAPINE 5 MG PO TBDP
5.0000 mg | ORAL_TABLET | Freq: Once | ORAL | Status: AC
  Administered 2017-06-07: 5 mg via ORAL
  Filled 2017-06-07 (×2): qty 1

## 2017-06-07 NOTE — [facility] Group Notes (Signed)
[facility]   06/07/2017  8:45 AM  Participation Quality:  Active  Mood/Affect:  Appropriate  Plan for Discharge/Comments: Pt states that her mood if fine today but she is very sleepy. Pt reports that she thinks it is the meds that are making her sleepy.  Transportation Means: Pt has access to transporation  Supports: Family  Jonathon JordanLynn B Shahzaib Azevedo, MSW, LCSWA 06/07/2017 10:30 AM

## 2017-06-07 NOTE — Progress Notes (Signed)
D: Pt presents with a flat affect and anxious mood. Pt wide-eyed with an intense stare. Pt appears to be preoccupied. Pt reports decreased paranoia and racing thoughts today. Pt denies AVH. Pt denies SI. Pt reports ongoing depression and anxiety. Pt rates depression 5/10. Anxiety 5/10. Hopelessness 5/10. Pt stated that she's tolerating the Zyprexa well but it makes her feel groggy in the morning.  A: Medications reviewed with pt. Medications administered as ordered per MD. Verbal support provided. Pt encouraged to attend groups. 15 minute checks performed for safety. R: Pt compliant with tx.

## 2017-06-07 NOTE — Plan of Care (Signed)
Problem: Activity: Goal: Sleeping patterns will improve Outcome: Progressing Patient denies need for sleeping medications. Patient reports sleeping well to Clinical research associatewriter.  Problem: Safety: Goal: Periods of time without injury will increase Outcome: Progressing Patient is on q15 minute safety checks and low fall risk precautions. Patient contracts for safety on the unit and remains safe at this time.

## 2017-06-07 NOTE — Plan of Care (Signed)
Problem: Education: Goal: Knowledge of the prescribed therapeutic regimen will improve Outcome: Progressing Pt verbalizes understanding of med regimen. Pt able to recall meds and identify what each med is given for.

## 2017-06-07 NOTE — Progress Notes (Signed)
Pt rated her day a 7. Pt goal for tomorrow is to have a 10.

## 2017-06-07 NOTE — Progress Notes (Signed)
Nursing Progress Note 1900-0730  D) Patient attended group this evening but returned to her room early. Patient observed in bed during RN assessment. Patient appeared withdrawn and minimal. Patient denied need from writer and reported sleeping "just fine" without medications. Patient denies SI/HI/AVH or pain. Patient contracts for safety on the unit.  A) Emotional support given. 1:1 interaction and active listening provided. No medications ordered/requests this evening. Snacks and fluids provided. Opportunities for questions or concerns presented to patient. Patient encouraged to continue to work on treatment goals. Labs, vital signs and patient behavior monitored throughout shift. Patient safety maintained with q15 min safety checks. Low fall risk precautions in place and reviewed with patient; patient verbalized understanding.  R) Patient receptive to interaction with nurse. Patient remains safe on the unit at this time. Patient is resting in bed without complaints. Will continue to monitor.

## 2017-06-07 NOTE — Progress Notes (Signed)
Recreation Therapy Notes  Date: 06/07/17 Time: 0930 Location: [address] Group Room  Group Topic: Stress Management  Goal Area(s) Addresses:  Patient will verbalize importance of using healthy stress management.  Patient will identify positive emotions associated with healthy stress management.   Intervention: Stress Management  Activity :  Meditation.  LRT introduced the stress management technique of meditation.  LRT played meditation from the Calm app which focused on gratitude.  Patients were to follow along as the meditation played in order to fully engage in the activity.  Education:  Stress Management, Discharge Planning.   Education Outcome: Acknowledges edcuation/In group clarification offered/Needs additional education  Clinical Observations/Feedback:  Pt did not attend group.   Akaila Rambo, LRT/CTRS         Aleida Crandell A 06/07/2017 12:08 PM 
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
## 2017-06-08 ENCOUNTER — Encounter (HOSPITAL_COMMUNITY): Payer: Self-pay | Admitting: Behavioral Health

## 2017-06-08 NOTE — [facility] Group Notes (Signed)
[facility] LCSW Group Therapy 06/08/2017 1:15pm  Type of Therapy and Topic: Group Therapy: Avoiding Self-Sabotaging and Enabling Behaviors   Participation Level: Pt was present for the duration of group. Did not participate in the discussion but listened attentively.    Donnelly StagerLynn Spencer Peterkin, MSW, LCSWA 06/08/2017 4:02 PM

## 2017-06-08 NOTE — Progress Notes (Signed)
Patient attended group and said that her day was an 8. Pt. was excited because she speak to her children today,

## 2017-06-08 NOTE — Progress Notes (Signed)
D: Pt was in the dayroom upon initial approach.  Pt presents with anxious affect and mood.  Her goal today was "to stay calm and have a good day."  Pt reports she continues to have racing thoughts, although they have improved since admission.  Pt denies SI/HI, denies hallucinations, denies pain.  Pt has been visible in milieu interacting with peers and staff appropriately.  Pt attended evening group.    A: Introduced self to pt.  Actively listened to pt and offered support and encouragement. Medication administered per order.  Q15 minute safety checks maintained.  R: Pt is safe on the unit.  Pt is compliant with medication.  Pt verbally contracts for safety.  Will continue to monitor and assess.

## 2017-06-08 NOTE — Plan of Care (Signed)
Problem: Activity: Goal: Interest or engagement in leisure activities will improve Outcome: Progressing Patient is participating in leisure activities, going outside and to the gym
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
## 2017-06-08 NOTE — [facility] Group Notes (Signed)
[facility] Group Notes:  Leisure and Lifestyle Changes   Date:  06/08/2017  Time:  11:08 AM  Type of Therapy:  Psychoeducational Skills  Participation Level:  Active  Participation Quality:  Appropriate and Attentive  Affect:  Blunted  Cognitive:  Appropriate  Insight:  Appropriate  Engagement in Group:  Engaged  Modes of Intervention:  Discussion and Education  Summary of Progress/Problems: Patient attended group and was engaged. She was seen nodding her head in agreement intermittently.  Esias Mory E 06/08/2017, 11:08 AM

## 2017-06-08 NOTE — Progress Notes (Signed)
Patient ID: Myrtie Somanshley L Vancuren, female   DOB: Sep 05, 1981, 36 y.o.   MRN: [id number]  DAR: Pt. Denies SI/HI and A/V Hallucinations. She does reports some episodes of derealization throughout the day and racing thoughts. She reports, "in the cafeteria I was like, what is Hi-[employer], why is it here?" She reports, "I know it's not real."  Patient presents to writer with cautious interaction. She is appropriate in conversation and does appear to be in touch with reality when speaking with this Clinical research associatewriter. She reports feeling that today is better for her than yesterday. Support and encouragement provided to the patient. Scheduled medications administered to patient per physician's orders. Patient is receptive and cooperative. She reports sleep is good, appetite is good, energy level is normal, and concentration is good. She rates depression 2/10, hopelessness 2/10, and anxiety 3/10. She is seen in the milieu interacting with peers and is attending groups. Q15 minute checks are maintained for safety.

## 2017-06-09 MED ORDER — OLANZAPINE 5 MG PO TABS
5.0000 mg | ORAL_TABLET | Freq: Two times a day (BID) | ORAL | Status: DC
  Administered 2017-06-09 – 2017-06-11 (×4): 5 mg via ORAL
  Filled 2017-06-09 (×8): qty 1

## 2017-06-09 MED ORDER — LAMOTRIGINE 25 MG PO TABS
50.0000 mg | ORAL_TABLET | Freq: Two times a day (BID) | ORAL | Status: DC
  Administered 2017-06-09 – 2017-06-10 (×2): 50 mg via ORAL
  Filled 2017-06-09 (×6): qty 2

## 2017-06-09 NOTE — [facility] Group Notes (Signed)
[facility] LCSW Group Therapy 06/09/2017 1:15 PM  Type of Therapy: Group Therapy- Emotion Regulation  Participation Level: Active   Participation Quality:  Appropriate  Affect: Appropriate  Cognitive: Alert and Oriented   Insight:  Developing/Improving  Engagement in Therapy: Developing/Improving and Engaged   Modes of Intervention: Clarification, Confrontation, Discussion, Education, Exploration, Limit-setting, Orientation, Problem-solving, Rapport Building, Dance movement psychotherapisteality Testing, Socialization and Support  Summary of Progress/Problems: The topic for group today was emotional regulation. This group focused on both positive and negative emotion identification and allowed group members to process ways to identify feelings, regulate negative emotions, and find healthy ways to manage internal/external emotions. Group members were asked to reflect on a time when their reaction to an emotion led to a negative outcome and explored how alternative responses using emotion regulation would have benefited them. Group members were also asked to discuss a time when emotion regulation was utilized when a negative emotion was experienced. Pt states that anger is difficult for her to regulate and one way that helps her to regulate this emotion is by removing herself from the situation.   Stephanie StagerLynn Antoinett Petty, MSW, [facility]WA 06/09/2017 3:34 PM

## 2017-06-09 NOTE — Progress Notes (Signed)
Adult Psychoeducational Group Note  Date:  06/09/2017 Time:  9:24 PM  Group Topic/Focus:  Wrap-Up Group:   The focus of this group is to help patients review their daily goal of treatment and discuss progress on daily workbooks.  Participation Level:  Active  Participation Quality:  Appropriate  Affect:  Appropriate  Cognitive:  Alert  Insight: Appropriate  Engagement in Group:  Engaged  Modes of Intervention:  Discussion  Additional Comments:  Patient stated her day was pretty good. Patient's goal for today was to keep moving forward towards discharge.   Aldena Worm L Yoav Okane 06/09/2017, 9:24 PM
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
## 2017-06-09 NOTE — Progress Notes (Signed)
D Pt is seen out in the milieu. She is able to identify that her racing thoughts are responding well to the zyprexa that she is taking and requests the dosage be increased. She says she understands she has a chemical imbalance, caused by the birth control device she was on. A SHe completed her daily assessment and on this she wrote she deneid SI today and she rated her depression, hopelessness and anxeity " 2/1/3", respectively. She participates in her groups, is anxious about her impending discharge and she is encouraged to focus on her daily golas. R Safety in place and therapeutic relationship fostered.
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
## 2017-06-09 NOTE — Progress Notes (Addendum)
  [facility] :  Will you be returning to the same living situation after discharge:  Yes,  pt returning home. At discharge, do you have transportation home?: Yes,  pt has access to transportation. Do you have the ability to pay for your medications: Yes,  pt has insurance.  Release of information consent forms completed and in the chart;  Patient's signature needed at discharge.  Patient to Follow up at: Follow-up Information    [facility] Follow up on 06/19/2017.   Specialty:  Behavioral Health Why:  Intensive outpatient assessment appointment 7/16 at 8:45. Please arrive 15 minutes early to complete paperwork. Please call the office if you need to cancel/reschedule your appointment.Thank you. Contact information: [address] [place] [id number] [phone]          [facility]  Safety Planning and Suicide Prevention discussed: Yes,  with pt.  SPE also provided to mother on 06/10/17.  Ambrose MantleMareida Grossman-Orr, LCSW  Have you used any form of tobacco in the last 30 days? (Cigarettes, Smokeless Tobacco, Cigars, and/or Pipes): Yes  Has patient been referred to the Quitline?: Patient refused referral  Patient has been referred for addiction treatment: Yes  Jonathon JordanLynn B Bryant, MSW, LCSWA 06/09/2017, 3:52 PM

## 2017-06-09 NOTE — Progress Notes (Signed)
Recreation Therapy Notes  Date: 06/09/17 Time: 0930 Location: [address] Dayroom  Group Topic: Stress Management  Goal Area(s) Addresses:  Patient will verbalize importance of using healthy stress management.  Patient will identify positive emotions associated with healthy stress management.   Intervention: Stress Management  Activity :  Meditation.  LRT introduced the stress management technique of meditation.  LRT played Petty meditation from the calm app to allow patients to become centered and clear their minds and thoughts.  Patients were to follow along as the meditation played to fully engage in the activity.  Education:  Stress Management, Discharge Planning.   Education Outcome: Acknowledges edcuation/In group clarification offered/Needs additional education  Clinical Observations/Feedback: Pt did not attend group.   Stephanie Petty, LRT/CTRS         Stephanie Petty 06/09/2017 12:28 PM 

## 2017-06-09 NOTE — [facility] Suicide Risk Assessment (Signed)
[facility] INPATIENT:  Family/Significant Other Suicide Prevention Education  Suicide Prevention Education:  Contact Attempts: Bonita QuinLinda (mom [phone]), has been identified by the patient as the family member/significant other with whom the patient will be residing, and identified as the person(s) who will aid the patient in the event of a mental health crisis.  With written consent from the patient, two attempts were made to provide suicide prevention education, prior to and/or following the patient's discharge.  We were unsuccessful in providing suicide prevention education.  A suicide education pamphlet was given to the patient to share with family/significant other.  Date and time of first attempt: 06/09/17 @3 :45pm. No answer, CSW left a message.   Stephanie JordanLynn B Lyncoln Petty, MSW, LCSWA 06/09/2017, 3:47 PM

## 2017-06-10 MED ORDER — LAMOTRIGINE 25 MG PO TABS
25.0000 mg | ORAL_TABLET | Freq: Two times a day (BID) | ORAL | Status: DC
  Administered 2017-06-11: 25 mg via ORAL
  Filled 2017-06-10 (×5): qty 1

## 2017-06-10 MED ORDER — BENZTROPINE MESYLATE 0.5 MG PO TABS: 0.5000 mg | ORAL_TABLET | Freq: Two times a day (BID) | ORAL | Status: DC | PRN

## 2017-06-10 NOTE — Plan of Care (Signed)
Problem: Coping: Goal: Ability to demonstrate self-control will improve Outcome: Progressing Pt has been in control of her behavior tonight.    

## 2017-06-10 NOTE — [facility] Group Notes (Signed)
[facility] Group Notes:  (Nursing/MHT/Case Management/Adjunct)  Date:  06/10/2017  Time:  2:00 pm   Type of Therapy:  Psychoeducational Skills  Participation Level:  Minimal  Participation Quality:  Appropriate  Affect:  Anxious and Appropriate  Cognitive:  Alert, Appropriate and Oriented  Insight:  Improving  Engagement in Group:  Developing/Improving  Modes of Intervention:  Discussion and Education  Summary of Progress/Problems: Group discussed communication skills and what works for her. Pt offered supportive feedback to peers. Jimmey Ralpherez, Leam Madero M 06/10/2017, 4:21 PM
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
## 2017-06-10 NOTE — Progress Notes (Signed)
D: Pt was in her room with visitor upon initial approach.  Appropriate affect and mood.  Reports having a good visit with her mother tonight.  Goal is to "get closer to going home, I'm leaving tomorrow."  Reports she feels safe to discharge tomorrow and she plans to follow up outpatient for aftercare.  Denies SI/HI, hallucinations, pain.  Visible in milieu interacting with peers and staff appropriately.  Attended evening group.  A: Met with pt and provided support and encouragement.  Medication administered per order.  Q15 minute safety checks maintained.  R: Pt is compliant with medication.  She verbally contracts for safety.  Will continue to monitor and assess.

## 2017-06-10 NOTE — Progress Notes (Signed)
D: Pt was in the dayroom upon initial approach.  Pt presents with appropriate affect and mood.  She reports she feels "a little woozy" tonight.  She is steady on her feet.  Pt denies SI/HI, denies hallucinations, denies pain.  Pt discussed how some of her medications were increased today.  Her goal is to "participate and get closer to going home."  Pt has been visible in milieu interacting with peers and staff appropriately.  Pt attended evening group.    A: Actively listened to pt and offered support and encouragement. Medication administered per order.  Medication education provided.  Fall prevention techniques reviewed with pt, pt verbalized understanding.    R: Pt is safe on the unit.  Pt is compliant with medication.  Pt verbally contracts for safety.  Will continue to monitor and assess.

## 2017-06-10 NOTE — Plan of Care (Signed)
Problem: Activity: Goal: Interest or engagement in activities will improve Outcome: Progressing Pt has been visible in milieu interacting appropriately with peers.  Attended evening group.

## 2017-06-10 NOTE — [facility] Group Notes (Signed)
Adult Therapy Group Note (Clinical Social Work)  Date:  06/10/2017  Time:  10:00-11:00AM  Group Topic/Focus:  HEALTHY COPING SKILLS  Today's group focused on identifying healthy coping skills already in use by each patient, as well as healthy coping skills they would like to learn.  There was much sharing, support, psychoeducation, and encouragement provided.  Participation Level:  Active  Participation Quality:  Drowsy  Affect:  Appropriate  Cognitive:  Appropriate  Insight: Improving  Engagement in Group:  Developing/Improving  Modes of Intervention:  Discussion, Support and Processing  Additional Comments:  The patient shared that healthy coping already used is prayer, and did not talk much after that.  Carloyn JaegerMareida J Grossman-Orr 05/06/2017, 1:28 PM

## 2017-06-10 NOTE — [facility] Suicide Risk Assessment (Signed)
[facility] INPATIENT:  Family/Significant Other Suicide Prevention Education  Suicide Prevention Education:  Education Completed; Leroy SeaLinda Troxel, mother, [phone] has been identified by the patient as the family member/significant other with whom the patient will be residing, and identified as the person(s) who will aid the patient in the event of a mental health crisis (suicidal ideations/suicide attempt).  With written consent from the patient, the family member/significant other has been provided the following suicide prevention education, prior to the and/or following the discharge of the patient.  Mother was adamant and grateful that patient's new medications are working well and that she has seen a vast improvement, feels patient is ready to discharge.  She stated there are no firearms in patient's home, and talked positively about patient's support system.  The suicide prevention education provided includes the following:  Suicide risk factors  Suicide prevention and interventions  National Suicide Hotline telephone number  [facility] assessment telephone number  [facility] Assistance 911  [facility] and/or [facility] telephone number  Request made of family/significant other to:  Remove weapons (e.g., guns, rifles, knives), all items previously/currently identified as safety concern.    Remove drugs/medications (over-the-counter, prescriptions, illicit drugs), all items previously/currently identified as a safety concern.  The family member/significant other verbalizes understanding of the suicide prevention education information provided.  The family member/significant other agrees to remove the items of safety concern listed above.    Carloyn JaegerMareida J Grossman-Orr 06/10/2017, 8:59 AM

## 2017-06-10 NOTE — Progress Notes (Signed)
D: Pt presents animated but anxious. A & O X4. Denies SI, HI, AVH and pain. Reports she's sleeping and eating well with normal energy and good concentration level. Rates her depression 0/10, hopelessness 0/10 and anxiety 1.5/10. Pt's goal for today "Getting closer to going home". Per pt "I'm bored being in here, I'm ready to go home".  A: Encouraged pt to voice concerns and comply with treatment regimen. Support and availability offered. Medications given as per MD's orders and effects monitored. Asigned NP informed of pt's report of generalized "tingling sensation all over my body, I feel like I I've had some drinks" after AM medications. Adjustment made to pt's Lamictal. Routine safety checks effective without issues to note thus far. R: Receptive to care. Reports generalized tingling sensation from her medications. Attended groups. Pt in agreement with medication changes. Tolerates all PO intake well. Remains safe on and off unit.

## 2017-06-10 NOTE — Plan of Care (Signed)
Problem: Medication: Goal: Compliance with prescribed medication regimen will improve Medications offered as ordered and pt compliant without issues. Denies adverse drug reactions when assessed.

## 2017-06-11 MED ORDER — TRAZODONE HCL 50 MG PO TABS: 50.0000 mg | ORAL_TABLET | Freq: Every evening | ORAL | 0 refills | Status: DC | PRN

## 2017-06-11 MED ORDER — LAMOTRIGINE 25 MG PO TABS: 25.0000 mg | ORAL_TABLET | Freq: Two times a day (BID) | ORAL | 0 refills | Status: DC

## 2017-06-11 MED ORDER — HYDROXYZINE HCL 25 MG PO TABS: 25.0000 mg | ORAL_TABLET | Freq: Three times a day (TID) | ORAL | 0 refills | Status: DC | PRN

## 2017-06-11 MED ORDER — CITALOPRAM HYDROBROMIDE 10 MG PO TABS: 10.0000 mg | ORAL_TABLET | Freq: Every day | ORAL | 0 refills | Status: DC

## 2017-06-11 MED ORDER — BENZTROPINE MESYLATE 0.5 MG PO TABS: 0.5000 mg | ORAL_TABLET | Freq: Two times a day (BID) | ORAL | 0 refills | Status: DC | PRN

## 2017-06-11 MED ORDER — OLANZAPINE 5 MG PO TABS: 5.0000 mg | ORAL_TABLET | Freq: Two times a day (BID) | ORAL | 0 refills | Status: DC

## 2017-06-11 NOTE — Progress Notes (Signed)
Patient attended group and said her day was a 8.  She was excited because she will be leaving tomorrow.
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
## 2017-06-11 NOTE — Progress Notes (Signed)
Patient ID: Stephanie Petty, female   DOB: 18-Apr-1981, 36 y.o.   MRN: [id number]  DAR: Pt. Denies SI/HI and A/V Hallucinations. She reports her racing thoughts are getting better. She reports sleep is good, energy level is normal, appetite is good, and concentration is good. She rates depression 0/10, hopelessness 0/10, and anxiety 1.5/10.  Patient does not report any pain or discomfort at this time. Support and encouragement provided to the patient. Scheduled medications administered to patient per physician's orders. She reports, "the tingling is gone." She reports no other physical symptoms. Patient is receptive and cooperative. Her affect is bright and mood and is pleasant. She reports that she feels ready for discharge. Q15 minute checks are maintained for safety.

## 2017-06-11 NOTE — [facility] Suicide Risk Assessment (Signed)
[facility]   Principal Problem: Major depressive disorder, recurrent episode with mood-congruent psychotic features [facility](HCC) Discharge Diagnoses:  Patient Active Problem List   Diagnosis Date Noted  . 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   HENT: Negative.   Eyes: Negative.   Respiratory: Negative.   Cardiovascular: Negative.   Skin: Negative.     Blood pressure 129/73, pulse 99, temperature 98.6 F (37 C), temperature source Oral, resp. rate 16, height 5\' 4"  (1.626 m), weight 70.3 kg (155 lb), SpO2 100 %, unknown if currently breastfeeding.Body mass index is 26.61 kg/m.  General Appearance: Neatly dressed, pleasant, engaging well and cooperative. Appropriate behavior. Not in any distress. Good relatedness. Not internally stimulated  Eye Contact::  Good  Speech:  Spontaneous, normal prosody. Normal tone and rate.   Volume:  Normal  Mood:  Euthymic  Affect:  Appropriate and Full Range  Thought Process:  Goal Directed and Linear  Orientation:  Full (Time, Place, and Person)  Thought Content:  Future oriented. No delusional theme. No preoccupation with violent thoughts. No negative ruminations. No obsession.  No hallucination in any modality.   Suicidal Thoughts:  No  Homicidal Thoughts:  No  Memory:  Immediate;   Good Recent;   Good Remote;   Good  Judgement:  Good  Insight:  Good  Psychomotor Activity:  Normal  Concentration:  Good  Recall:  Good  Fund of Knowledge:Good  Language: Good  Akathisia:  Negative  Handed:    AIMS (if indicated):     Assets:  Communication Skills Desire for Improvement Housing Physical  Health Resilience Transportation Vocational/Educational  Sleep:  Number of Hours: 6.75  Cognition: WNL  ADL's:  Intact   Clinical Assessment::   36 yo Caucasian female, divorced, lives with her two minor children, employed as a Sales executivedental assistant. Background history of Post natal depression. [facility]ac[facility]. Expressed intense mood symptoms. Expressed feelings of derealization and depersonalization. Was emotionally labile and expressed none specific suicidal thoughts. Blamed feelings on recent use of IUD and adjustment of her psychotropic medication. Has removed the device.  Patient's medications was readjusted while she was here. She reports feeling good again. Feel of dissociation has resolved. She is now able to engage with routine activities. She is able to focus on task. Her thoughts are not crowded. No perceptual abnormality. No longer having suicidal thoughts. She is looking forward to spend time with her family again. She is now sleeping well at night. Energy and appetite are back to normal. Has normal sense of self. No homicidal or violent thoughts. No overwhelming anxiety. Normal cognitive abilities. No access to weapons. No past suicidal behavior. No family history of mental illness.  Nursing staff reports that patient has been appropriate on the unit. Patient has been interacting well with peers. No behavioral issues. Patient has not voiced any suicidal thoughts. Patient has not been observed to be internally stimulated. Patient has been adherent with treatment recommendations. Patient has been tolerating their medication well.   Patient was discussed at team. Team members feels that patient is back to her baseline level of function. Team agrees with plan to discharge patient today.    Demographic Factors:  Divorced or widowed and Caucasian  Loss Factors: NA  Historical Factors: NA  Risk Reduction Factors:   Responsible for children under 43  years of age, Sense of responsibility to family, Employed, Living with another person, especially a relative, Positive social support, Positive therapeutic relationship and Positive coping skills or problem solving skills  Continued Clinical Symptoms:  As above   Cognitive Features That Contribute To Risk:  None    Suicide Risk:  Minimal: No identifiable suicidal ideation.  Patient is not having any thoughts of suicide at this time. Modifiable risk factors targeted during this admission includes depression and mood symptoms. Demographical and historical risk factors cannot be modified. Patient is now engaging well. Patient is reliable and is future oriented. We have buffered patient's support structures. At this point, patient is at low risk of suicide. Patient is aware of the effects of psychoactive substances on decision making process. Patient has been provided with emergency contacts. Patient acknowledges to use resources provided if unforseen circumstances changes their current risk stratification.    Follow-up Information    [facility] Follow up on 06/19/2017.   Specialty:  Behavioral Health Why:  Intensive outpatient assessment appointment 7/16 at 8:45. Please arrive 15 minutes early to complete paperwork. Please call the office if you need to cancel/reschedule your appointment.Thank you. Contact information: [address] [place] [address] [phone]          Plan Of Care/Follow-up recommendations:  1. Continue current psychotropic medications 2. Mental health and addiction follow up as arranged.  3. Discharge in care of her mom 4. Provided limited quantity of prescriptions   Georgiann Cocker, MD 06/11/2017, 10:31 AM

## 2017-06-11 NOTE — [facility] Group Notes (Signed)
[facility]:  (Clinical Social Work)   05/14/2017    1:00-2:00PM  Summary of Progress/Problems:   The main focus of today's process group was to   1)  discuss importance of adding supports to stay well once out of the hospital  2)  generate ideas about what healthy supports can be added  3)  provide support regarding patient fears about discharge  4)  give examples of educational opportunities to learn about diagnoses  An emphasis was placed on using counselor, doctor, therapy groups, 12-step groups, and problem-specific support groups to expand supports.  We also discussed being a better supporter of oneself and making self-care a priority, talking at length about how if humans do not care first and foremost for themselves, they have nothing to give to or take care of others.  The patient stated she is looking forward to going home, feels ready, but is also somewhat nervous.  Type of Therapy:  Process Group with Motivational Interviewing  Participation Level:  Active  Participation Quality:  Attentive and Sharing  Affect:  Blunted  Cognitive:  Appropriate and Oriented  Insight:  Engaged  Engagement in Therapy:  Engaged  Modes of Intervention:   Education, Support and Processing  Ambrose MantleMareida Grossman-Orr, LCSW 05/14/2017    2:17 PM

## 2017-06-11 NOTE — Progress Notes (Signed)
Patient ID: Stephanie Petty, female   DOB: 1981/04/25, 36 y.o.   MRN: [id number]  Discharge Note: Belongings returned to patient at time of discharge. Discharge instructions and medications were reviewed with patient. Patient verbalized understanding of both medications and discharge instructions. Patient is discharged to lobby where her ride is waiting. Q15 minute safety checks maintained until time of discharge. No distress noted in patient upon discharge.

## 2017-06-27 ENCOUNTER — Inpatient Hospital Stay (HOSPITAL_COMMUNITY)
Admission: AD | Admit: 2017-06-27 | Discharge: 2017-07-01 | DRG: 885 | Disposition: A | Discharge status: Home / Self Care | Payer: BLUE CROSS/BLUE SHIELD | Source: Intra-hospital | Attending: Psychiatry | Admitting: Psychiatry

## 2017-06-27 ENCOUNTER — Encounter (HOSPITAL_COMMUNITY): Payer: Self-pay

## 2017-06-27 ENCOUNTER — Emergency Department (HOSPITAL_COMMUNITY)
Admission: EM | Admit: 2017-06-27 | Discharge: 2017-06-27 | Disposition: A | Discharge status: Other Acute Inpatient Hospital | Payer: BLUE CROSS/BLUE SHIELD | Attending: Emergency Medicine | Admitting: Emergency Medicine

## 2017-06-27 ENCOUNTER — Encounter (HOSPITAL_COMMUNITY): Payer: Self-pay | Admitting: *Deleted

## 2017-06-27 DIAGNOSIS — N189 Chronic kidney disease, unspecified: Secondary | ICD-10-CM | POA: Diagnosis present

## 2017-06-27 DIAGNOSIS — F411 Generalized anxiety disorder: Secondary | ICD-10-CM | POA: Diagnosis present

## 2017-06-27 DIAGNOSIS — F17200 Nicotine dependence, unspecified, uncomplicated: Secondary | ICD-10-CM | POA: Diagnosis not present

## 2017-06-27 DIAGNOSIS — Z87442 Personal history of urinary calculi: Secondary | ICD-10-CM | POA: Diagnosis not present

## 2017-06-27 DIAGNOSIS — Z818 Family history of other mental and behavioral disorders: Secondary | ICD-10-CM

## 2017-06-27 DIAGNOSIS — F332 Major depressive disorder, recurrent severe without psychotic features: Principal | ICD-10-CM | POA: Diagnosis present

## 2017-06-27 DIAGNOSIS — Z888 Allergy status to other drugs, medicaments and biological substances status: Secondary | ICD-10-CM | POA: Diagnosis not present

## 2017-06-27 DIAGNOSIS — Z79899 Other long term (current) drug therapy: Secondary | ICD-10-CM | POA: Insufficient documentation

## 2017-06-27 DIAGNOSIS — K589 Irritable bowel syndrome without diarrhea: Secondary | ICD-10-CM | POA: Diagnosis present

## 2017-06-27 DIAGNOSIS — F333 Major depressive disorder, recurrent, severe with psychotic symptoms: Secondary | ICD-10-CM | POA: Diagnosis present

## 2017-06-27 DIAGNOSIS — R45851 Suicidal ideations: Secondary | ICD-10-CM | POA: Diagnosis present

## 2017-06-27 DIAGNOSIS — F17210 Nicotine dependence, cigarettes, uncomplicated: Secondary | ICD-10-CM | POA: Diagnosis present

## 2017-06-27 DIAGNOSIS — F39 Unspecified mood [affective] disorder: Secondary | ICD-10-CM | POA: Diagnosis not present

## 2017-06-27 DIAGNOSIS — F419 Anxiety disorder, unspecified: Secondary | ICD-10-CM | POA: Diagnosis not present

## 2017-06-27 LAB — CBC
HCT: 42.2 % (ref 36.0–46.0)
Hemoglobin: 14.6 g/dL (ref 12.0–15.0)
MCH: 31.4 pg (ref 26.0–34.0)
MCHC: 34.6 g/dL (ref 30.0–36.0)
MCV: 90.8 fL (ref 78.0–100.0)
PLATELETS: 251 10*3/uL (ref 150–400)
RBC: 4.65 MIL/uL (ref 3.87–5.11)
RDW: 12.3 % (ref 11.5–15.5)
WBC: 7.5 10*3/uL (ref 4.0–10.5)

## 2017-06-27 LAB — RAPID URINE DRUG SCREEN, HOSP PERFORMED
AMPHETAMINES: NOT DETECTED
Barbiturates: NOT DETECTED
Benzodiazepines: NOT DETECTED
COCAINE: NOT DETECTED
OPIATES: NOT DETECTED
Tetrahydrocannabinol: NOT DETECTED

## 2017-06-27 LAB — COMPREHENSIVE METABOLIC PANEL
ALT: 18 U/L (ref 14–54)
ANION GAP: 7 (ref 5–15)
AST: 19 U/L (ref 15–41)
Albumin: 4.4 g/dL (ref 3.5–5.0)
Alkaline Phosphatase: 47 U/L (ref 38–126)
BUN: 8 mg/dL (ref 6–20)
CHLORIDE: 105 mmol/L (ref 101–111)
CO2: 28 mmol/L (ref 22–32)
CREATININE: 0.79 mg/dL (ref 0.44–1.00)
Calcium: 9.4 mg/dL (ref 8.9–10.3)
Glucose, Bld: 102 mg/dL — ABNORMAL HIGH (ref 65–99)
POTASSIUM: 3.6 mmol/L (ref 3.5–5.1)
Sodium: 140 mmol/L (ref 135–145)
Total Bilirubin: 0.4 mg/dL (ref 0.3–1.2)
Total Protein: 6.9 g/dL (ref 6.5–8.1)

## 2017-06-27 LAB — ETHANOL

## 2017-06-27 LAB — PREGNANCY, URINE: Preg Test, Ur: NEGATIVE

## 2017-06-27 LAB — ACETAMINOPHEN LEVEL

## 2017-06-27 LAB — SALICYLATE LEVEL

## 2017-06-27 MED ORDER — NICOTINE 21 MG/24HR TD PT24: 21.0000 mg | MEDICATED_PATCH | Freq: Every day | TRANSDERMAL | Status: DC | PRN

## 2017-06-27 MED ORDER — BENZTROPINE MESYLATE 1 MG PO TABS: 0.5000 mg | ORAL_TABLET | Freq: Two times a day (BID) | ORAL | Status: DC | PRN

## 2017-06-27 MED ORDER — ALUM & MAG HYDROXIDE-SIMETH 200-200-20 MG/5ML PO SUSP: 30.0000 mL | Freq: Four times a day (QID) | ORAL | Status: DC | PRN

## 2017-06-27 MED ORDER — ZOLPIDEM TARTRATE 5 MG PO TABS: 5.0000 mg | ORAL_TABLET | Freq: Every evening | ORAL | Status: DC | PRN

## 2017-06-27 MED ORDER — CITALOPRAM HYDROBROMIDE 10 MG PO TABS: 10.0000 mg | ORAL_TABLET | Freq: Every day | ORAL | Status: DC

## 2017-06-27 MED ORDER — OLANZAPINE 5 MG PO TABS
5.0000 mg | ORAL_TABLET | Freq: Two times a day (BID) | ORAL | Status: DC
  Administered 2017-06-27: 5 mg via ORAL
  Filled 2017-06-27: qty 1

## 2017-06-27 MED ORDER — CITALOPRAM HYDROBROMIDE 10 MG PO TABS
10.0000 mg | ORAL_TABLET | Freq: Every day | ORAL | Status: DC
  Administered 2017-06-27: 10 mg via ORAL
  Filled 2017-06-27: qty 1

## 2017-06-27 MED ORDER — TRAZODONE HCL 50 MG PO TABS: 50.0000 mg | ORAL_TABLET | Freq: Every evening | ORAL | Status: DC | PRN

## 2017-06-27 MED ORDER — ONDANSETRON HCL 4 MG PO TABS: 4.0000 mg | ORAL_TABLET | Freq: Three times a day (TID) | ORAL | Status: DC | PRN

## 2017-06-27 MED ORDER — HYDROXYZINE HCL 25 MG PO TABS
25.0000 mg | ORAL_TABLET | Freq: Three times a day (TID) | ORAL | Status: DC | PRN
  Administered 2017-06-27: 25 mg via ORAL
  Filled 2017-06-27: qty 1

## 2017-06-27 MED ORDER — LAMOTRIGINE 25 MG PO TABS
25.0000 mg | ORAL_TABLET | Freq: Two times a day (BID) | ORAL | Status: DC
  Administered 2017-06-27: 25 mg via ORAL
  Filled 2017-06-27 (×2): qty 1

## 2017-06-27 MED ORDER — ACETAMINOPHEN 325 MG PO TABS: 650.0000 mg | ORAL_TABLET | ORAL | Status: DC | PRN

## 2017-06-27 NOTE — ED Notes (Signed)
TTS in progress at this time. PT reports feeling anxious and is requesting something to help with this.   PT provided with caffeine free coke and bag lunch as well.
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
## 2017-06-27 NOTE — ED Triage Notes (Signed)
Per Pt, Pt is coming from work with complaints of suicidal thoughts that have been taking place since Saturday. Pt has a Veterinary surgeoncounselor and was told to come here today for follow-up since she is still struggling with SI. Pt reports, "I have thought about driving into a tree. But I have more cognition than that and I have two kids."

## 2017-06-27 NOTE — Progress Notes (Signed)
Patient has been assessed and meets inpatient criteria for hospitalization.  Disposition CSW and TTS therapist reviewed pt's chart information with [facility] AC. Nira Retortina T., RN. For bed availability.    Pt has been accepted to [facility], Bed 403-2.   Ferne ReusJustina Okonkwo, NP is the accepting provider Dr. Nehemiah MassedFernando Cobos is the attending provider. Call report to [phone].   Patient may transfer for arrival at 9:00 PM when room will be ready.  Timmothy EulerJean T. Kaylyn LimSutter, MSW, LCSWA Disposition Clinical Social Work [phone] (cell) [phone] (office)
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
## 2017-06-27 NOTE — ED Notes (Signed)
Pt wanded by security. 

## 2017-06-27 NOTE — ED Notes (Signed)
[facility].

## 2017-06-28 DIAGNOSIS — F332 Major depressive disorder, recurrent severe without psychotic features: Secondary | ICD-10-CM | POA: Insufficient documentation

## 2017-06-28 DIAGNOSIS — Z818 Family history of other mental and behavioral disorders: Secondary | ICD-10-CM

## 2017-06-28 DIAGNOSIS — F39 Unspecified mood [affective] disorder: Secondary | ICD-10-CM

## 2017-06-28 DIAGNOSIS — F419 Anxiety disorder, unspecified: Secondary | ICD-10-CM

## 2017-06-28 MED ORDER — ACETAMINOPHEN 325 MG PO TABS
650.0000 mg | ORAL_TABLET | ORAL | Status: DC | PRN
Start: 2017-06-28 — End: 2017-07-01

## 2017-06-28 MED ORDER — BENZTROPINE MESYLATE 0.5 MG PO TABS: 0.5000 mg | ORAL_TABLET | Freq: Two times a day (BID) | ORAL | Status: DC | PRN

## 2017-06-28 MED ORDER — ZOLPIDEM TARTRATE 5 MG PO TABS: 5.0000 mg | ORAL_TABLET | Freq: Every evening | ORAL | Status: DC | PRN

## 2017-06-28 MED ORDER — OLANZAPINE 10 MG PO TABS
10.0000 mg | ORAL_TABLET | Freq: Two times a day (BID) | ORAL | Status: DC
  Administered 2017-06-28 – 2017-06-29 (×2): 10 mg via ORAL
  Filled 2017-06-28 (×4): qty 1

## 2017-06-28 MED ORDER — OLANZAPINE 7.5 MG PO TABS
7.5000 mg | ORAL_TABLET | Freq: Two times a day (BID) | ORAL | Status: DC
  Filled 2017-06-28 (×4): qty 1

## 2017-06-28 MED ORDER — CITALOPRAM HYDROBROMIDE 10 MG PO TABS
10.0000 mg | ORAL_TABLET | Freq: Every day | ORAL | Status: DC
  Filled 2017-06-28: qty 1

## 2017-06-28 MED ORDER — OLANZAPINE 5 MG PO TABS
5.0000 mg | ORAL_TABLET | Freq: Two times a day (BID) | ORAL | Status: DC
  Administered 2017-06-28: 5 mg via ORAL
  Filled 2017-06-28 (×3): qty 1

## 2017-06-28 MED ORDER — ALUM & MAG HYDROXIDE-SIMETH 200-200-20 MG/5ML PO SUSP: 30.0000 mL | Freq: Four times a day (QID) | ORAL | Status: DC | PRN

## 2017-06-28 MED ORDER — HYDROXYZINE HCL 25 MG PO TABS
25.0000 mg | ORAL_TABLET | Freq: Four times a day (QID) | ORAL | Status: DC | PRN
  Administered 2017-06-28 – 2017-06-29 (×3): 25 mg via ORAL
  Filled 2017-06-28 (×3): qty 1

## 2017-06-28 MED ORDER — TRAZODONE HCL 50 MG PO TABS: 50.0000 mg | ORAL_TABLET | Freq: Every evening | ORAL | Status: DC | PRN

## 2017-06-28 MED ORDER — HYDROXYZINE HCL 25 MG PO TABS
25.0000 mg | ORAL_TABLET | Freq: Three times a day (TID) | ORAL | Status: DC | PRN
  Administered 2017-06-28: 25 mg via ORAL

## 2017-06-28 MED ORDER — NICOTINE 21 MG/24HR TD PT24: 21.0000 mg | MEDICATED_PATCH | Freq: Every day | TRANSDERMAL | Status: DC | PRN

## 2017-06-28 MED ORDER — LAMOTRIGINE 25 MG PO TABS
25.0000 mg | ORAL_TABLET | Freq: Two times a day (BID) | ORAL | Status: DC
  Administered 2017-06-28 – 2017-06-29 (×3): 25 mg via ORAL
  Filled 2017-06-28 (×5): qty 1
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
## 2017-06-28 NOTE — Progress Notes (Signed)
Admission Note:  36 year old female who presents, in no acute distress, for the treatment of SI and Depression. Patient appears flat and depressed. Patient was calm and cooperative with admission process. Patient presents with passive SI and contracts for safety upon admission. Patient denies AVH.  [facility]  Patient reported SI with a plan to "run into a tree".  Patient states "I think I left the hospital too quick last time. I can't seem to redirect these suicidal thoughts".  Patient identifies "work stress" and "Going out in public" as her stressors.  Additionally, patient reports "disassociation" and states "it's not as bad as last time but I still feel it a little bit".  Patient lives with her two children and identifies her mother and friends as her support system.  [facility], patient would like to "get back to 100% self" and "learn coping skills for life".  Skin was assessed and found to be clear of any abnormal marks.  Patient searched and no contraband found, POC and unit policies explained and understanding verbalized. Consents obtained.  Patient had no additional questions or concerns.
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
## 2017-06-28 NOTE — [facility] Group Notes (Signed)
[facility] Group Therapy 06/28/2017 1:15pm  Type of Therapy: [employer] Presentation  Participation Level: Active  Participation Quality: Attentive  Affect: Appropriate  Cognitive: Oriented  Insight: Developing/Improving  Engagement in Therapy: Engaged  Modes of Intervention: Discussion, Education and Socialization  Summary of Progress/Problems: [employer] ([facility]) Speaker came to talk about his personal journey with living with a mental health diagnosis.The pt processed ways by which to relate to the speaker. MHA speaker provided handouts and educational information pertaining to groups and services offered by the MHA. Pt was engaged in speaker's presentation and was receptive to resources provided.    Ona Roehrs B. Viviana Trimble, MSW, LCSWA 06/28/2017 3:33 PM   

## 2017-06-28 NOTE — Progress Notes (Signed)
D:  Patient is sleeping fair; her appetite is fair; her energy level is normal and her concentration is normal.  Patient is attending groups and participating in her treatment.  Patient continues to report some depressive symptoms; she rates her depression as a 6.  Patient rates her hopelessness as a 7 and anxiety as a 5.  Patient's goal today is to "get better."  She denies any active thoughts of self harm today.  Her affect appears flat, blunted; her mood depressed. A: Continue to monitor medication management and MD orders.  Safety checks continued every 15 minutes per protocol.  Offer support and encouragement as needed. R: Patient is receptive to staff; her behavior is appropriate.

## 2017-06-28 NOTE — Progress Notes (Signed)
Recreation Therapy Notes  Date:06/28/2017 Time:9:30am Location: [address] Dayroom  Group Topic: Stress Management  Goal Area(s) Addresses:  Patient will verbalize importance of using healthy stress management.  Patient will identify positive emotions associated with healthy stress management.   Intervention: Stress Management  Activity: Calming Color Relaxation Visualization . Recreation Therapy Intern introduced the stress management technique of visualization. Recreation Therapy Intern read a script that allowed patients to mentally visualize different colors. Recreation Therapy Intern played calming flute music. Patients were to follow along as script was read to engage in the activity.  Education:Stress Management, Discharge Planning.   Education Outcome:Acknowledges edcuation  Clinical Observations/Feedback:Pt did not attend group.  Rachel Meyer, Recreation Therapy Intern  Cumi Sanagustin, LRT/CTRS 
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
## 2017-06-28 NOTE — Tx Team (Signed)
Initial Treatment Plan 06/28/2017 12:54 AM Myrtie SomanAshley L Liddell [id number]    PATIENT STRESSORS: Occupational concerns   PATIENT STRENGTHS: Ability for insight Average or above average intelligence Communication skills General fund of knowledge Motivation for treatment/growth Physical Health Supportive family/friends   PATIENT IDENTIFIED PROBLEMS: At risk for suicide  "To get back to 100% self"  "To learn coping skills for life"                 DISCHARGE CRITERIA:  Adequate post-discharge living arrangements Improved stabilization in mood, thinking, and/or behavior Motivation to continue treatment in a less acute level of care Need for constant or close observation no longer present Verbal commitment to aftercare and medication compliance  PRELIMINARY DISCHARGE PLAN: Outpatient therapy Return to previous living arrangement Return to previous work or school arrangements  PATIENT/FAMILY INVOLVEMENT: This treatment plan has been presented to and reviewed with the patient, Myrtie Somanshley L Lahue.  The patient and family have been given the opportunity to ask questions and make suggestions.  Carleene OverlieMiddleton, Nya Monds P, RN 06/28/2017, 12:54 AM

## 2017-06-29 MED ORDER — LAMOTRIGINE 25 MG PO TABS
50.0000 mg | ORAL_TABLET | Freq: Every day | ORAL | Status: DC
  Administered 2017-06-29 – 2017-06-30 (×2): 50 mg via ORAL
  Filled 2017-06-29 (×4): qty 2

## 2017-06-29 MED ORDER — OLANZAPINE 7.5 MG PO TABS
15.0000 mg | ORAL_TABLET | Freq: Every day | ORAL | Status: DC
  Administered 2017-06-30: 15 mg via ORAL
  Filled 2017-06-29 (×3): qty 2

## 2017-06-29 MED ORDER — LAMOTRIGINE 25 MG PO TABS
25.0000 mg | ORAL_TABLET | ORAL | Status: DC
  Administered 2017-06-30 – 2017-07-01 (×2): 25 mg via ORAL
  Filled 2017-06-29 (×4): qty 1

## 2017-06-29 MED ORDER — LORAZEPAM 0.5 MG PO TABS
0.5000 mg | ORAL_TABLET | Freq: Four times a day (QID) | ORAL | Status: DC | PRN
  Administered 2017-06-29 (×2): 0.5 mg via ORAL
  Filled 2017-06-29 (×2): qty 1

## 2017-06-29 NOTE — Progress Notes (Signed)
D:  Patient's self inventory sheet, patient sleeps good, no sleep medication.  Fair appetite, normal energy level, poor concentration.  Rated depression and hopeless 6, anxiety 9.  Denied withdrawals.  SI, contracts for safety.  Denied physical problems.  Denied physical pain.  Goal is try to get suicidal thoughts to go away.  Plans to pray.  No discharge plans. A:  Medications administered per MD orders.  Emotional support and encouragement given patient. R:  SI, contracts for safety.  Denied A/V hallucinations.  Safety maintained with 15 minute checks.

## 2017-06-29 NOTE — Plan of Care (Signed)
Problem: Education: Goal: Emotional status will improve Outcome: Progressing Nurse discussed depression/coping skills with patient.    
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
## 2017-06-29 NOTE — [facility] Group Notes (Signed)
[facility] LCSW Group Therapy 06/29/2017 1:15pm  Type of Therapy: Group Therapy- [facility]  Participation Level: Pt invited. Did not attend.   Donnelly StagerLynn Serena Petterson, MSW, LCSWA 06/29/2017 3:04 PM

## 2017-06-29 NOTE — Progress Notes (Signed)
Patient ID: Stephanie Petty, female   DOB: Nov 16, 1981, 36 y.o.   MRN: [id number]  Pt currently presents with a flat affect and anxious behavior. Pt reports to writer that their goal is to "get closer to my medications being right" Pt states "my day did get better tonight." Pt reports good sleep with current medication regimen.   Pt provided with medications per providers orders. Pt's labs and vitals were monitored throughout the night. Pt given a 1:1 about emotional and mental status. Pt supported and encouraged to express concerns and questions. Pt educated on medications and the process of titrating medications.   Pt's safety ensured with 15 minute and environmental checks. Pt endorses passive SI this morning, thoughts of "just not wanting to suffer anymore." Pt currently denies HI and A/V hallucinations. Pt verbally agrees to seek staff if SI/HI or A/VH occurs and to consult with staff before acting on any harmful thoughts. Will continue POC.

## 2017-06-29 NOTE — [facility] Group Notes (Signed)

## 2017-06-30 NOTE — Progress Notes (Signed)
Recreation Therapy Notes  Date: 06/30/2017 Time: 9:30am Location: [address] Dayroom  Group Topic: Stress Management  Goal Area(s) Addresses:  Patient will verbalize importance of using healthy stress management.  Patient will identify positive emotions associated with healthy stress management.   Intervention: Stress Management  Activity : Self-Esteem Meditation. Recreation Therapy Intern introduced the stress management technique of meditation. Recreation Therapy Intern read a script that allowed patients to focus on boosting their self-esteem. Recreation Therapy Intern played calming music. Patients were to follow along as script was read to engage in the activity.  Education: Stress Management, Discharge Planning.   Education Outcome: Needs additional education  Clinical Observations/Feedback: Pt did not attend group.  Rachel Meyer, Recreation Therapy Intern  Torben Soloway, LRT/CTRS 
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
## 2017-06-30 NOTE — Progress Notes (Signed)
Data. Patient denies SI/HI/AVH. Patient interacting well with staff and other patients. Patient's affect has been flay and anxious this shift. She has only asked for PRNs once and then didn't take it, stating, "I am OK. I haven't had to take anything all day. I am going to wait until bedtime." Patient also reports decreased depression and suicidality. "I think my meds are starting to kick in." Action. Emotional support and encouragement offered. Education provided on medication, indications and side effect. Q 15 minute checks done for safety. Response. Safety on the unit maintained through 15 minute checks.  Medications taken as prescribed. Attended groups. Remained calm and appropriate through out shift.

## 2017-06-30 NOTE — [facility] Group Notes (Signed)
[facility] LCSW Group Therapy 06/30/2017 1:15pm  Type of Therapy: Group Therapy- Feelings Around Relapse and Recovery  Participation Level: Active   Participation Quality:  Appropriate  Affect:  Appropriate  Cognitive: Alert and Oriented   Insight:  Developing   Engagement in Therapy: Developing/Improving and Engaged   Modes of Intervention: Clarification, Confrontation, Discussion, Education, Exploration, Limit-setting, Orientation, Problem-solving, Rapport Building, Dance movement psychotherapisteality Testing, Socialization and Support  Summary of Progress/Problems: The topic for today was feelings about relapse. The group discussed what relapse prevention is to them and identified triggers that they are on the path to relapse. Members also processed their feeling towards relapse and were able to relate to common experiences. Group also discussed coping skills that can be used for relapse prevention.  Pt's plan for preventing relapse upon discharge to [facility] and to start seeing a therapist there as well. Pt states that the last time she was in the hospital she was so focused on having to miss time from work that she was not able to get the help that she really needed. Pt states that this time she is making herself a priority.   Therapeutic Modalities:   Cognitive Behavioral Therapy Solution-Focused Therapy Assertiveness Training Relapse Prevention Therapy    Donnelly StagerLynn Rose Hippler, MSW, Theresia MajorsLCSWA [phone] 06/30/2017 2:50 PM

## 2017-06-30 NOTE — [facility] Suicide Risk Assessment (Signed)
[facility] INPATIENT:  Family/Significant Other Suicide Prevention Education  Suicide Prevention Education:  Contact Attempts: Stephanie Petty (mother [phone]), has been identified by the patient as the family member/significant other with whom the patient will be residing, and identified as the person(s) who will aid the patient in the event of a mental health crisis.  With written consent from the patient, two attempts were made to provide suicide prevention education, prior to and/or following the patient's discharge.  We were unsuccessful in providing suicide prevention education.  A suicide education pamphlet was given to the patient to share with family/significant other.  Date and time of first attempt: 06/30/17 @4 :06pm. A family member stated that Stephanie Petty stepped out and would return CSW's call later.   Stephanie Petty, MSW, LCSWA  06/30/2017, 4:07 PM

## 2017-06-30 NOTE — Progress Notes (Signed)
  [facility]  Will you be returning to the same living situation after discharge:  Yes,  pt returning home. At discharge, do you have transportation home?: Yes,  pt has access to transportation. Do you have the ability to pay for your medications: Yes,  pt has insurance.  Release of information consent forms completed and in the chart;  Patient's signature needed at discharge.  Patient to Follow up at: Follow-up Information    [facility] on 07/04/2017.   Why:  Medication management appointment 7/31 with [name] at 3:30pm. Rene KocherRegina will also be able to schedule your therapy appointment.  Contact information: [address] [place], [place] [address] P: [phone] F: [phone]          Next level of care provider has [facility] Link:no  Safety Planning and Suicide Prevention discussed: Yes,  with pt.  Have you used any form of tobacco in the last 30 days? (Cigarettes, Smokeless Tobacco, Cigars, and/or Pipes): Yes  Has patient been referred to the Quitline?: Patient refused referral  Patient has been referred for addiction treatment: Yes  Jonathon JordanLynn B Rockne Dearinger, MSW, LCSWA  06/30/2017, 4:24 PM

## 2017-06-30 NOTE — Tx Team (Signed)
Interdisciplinary Treatment and Diagnostic Plan Update 06/30/2017 Time of Session: 9:30am  Stephanie Petty  MRN: [id number]  Principal Diagnosis: Major depressive disorder, recurrent episode with mood-congruent psychotic features [facility](HCC)  Secondary Diagnoses: Principal Problem:   Major depressive disorder, recurrent episode with mood-congruent psychotic features (HCC)   Current Medications:  Current Facility-Administered Medications  Medication Dose Route Frequency Provider Last Rate Last Dose  . acetaminophen (TYLENOL) tablet 650 mg  650 mg Oral Q4H PRN Okonkwo, Justina A, NP      . alum & mag hydroxide-simeth (MAALOX/MYLANTA) 200-200-20 MG/5ML suspension 30 mL  30 mL Oral Q6H PRN Okonkwo, Justina A, NP      . lamoTRIgine (LAMICTAL) tablet 25 mg  25 mg Oral BH-q7a Cobos, Rockey SituFernando A, MD   25 mg at 06/30/17 [id number]  . lamoTRIgine (LAMICTAL) tablet 50 mg  50 mg Oral QHS Cobos, Rockey SituFernando A, MD   50 mg at 06/29/17 2106  . LORazepam (ATIVAN) tablet 0.5 mg  0.5 mg Oral Q6H PRN Money, Gerlene Burdockravis B, FNP   0.5 mg at 06/29/17 2344  . nicotine (NICODERM CQ - dosed in mg/24 hours) patch 21 mg  21 mg Transdermal Daily PRN Okonkwo, Justina A, NP      . OLANZapine (ZYPREXA) tablet 15 mg  15 mg Oral QHS Cobos, Rockey SituFernando A, MD        PTA Medications: Prescriptions Prior to Admission  Medication Sig Dispense Refill Last Dose  . citalopram (CELEXA) 10 MG tablet Take 1 tablet (10 mg total) by mouth daily. 30 tablet 0 06/27/2017 at Unknown time  . hydrOXYzine (ATARAX/VISTARIL) 25 MG tablet Take 1 tablet (25 mg total) by mouth 3 (three) times daily as needed for anxiety. 30 tablet 0 06/27/2017 at Unknown time  . lamoTRIgine (LAMICTAL) 25 MG tablet Take 1 tablet (25 mg total) by mouth 2 (two) times daily. 60 tablet 0 06/27/2017 at Unknown time  . OLANZapine (ZYPREXA) 7.5 MG tablet Take 7.5 mg by mouth 2 (two) times daily.   06/27/2017 at Unknown time    Treatment Modalities: Medication Management, Group therapy, Case  management,  1 to 1 session with clinician, Psychoeducation, Recreational therapy.  Patient Stressors: Occupational concerns Patient Strengths: Ability for insight Average or above average intelligence Wellsite geologistCommunication skills General fund of knowledge Motivation for treatment/growth Physical Health Supportive family/friends  Physician Treatment Plan for Primary Diagnosis: Major depressive disorder, recurrent episode with mood-congruent psychotic features (HCC) Long Term Goal(s): Improvement in symptoms so as ready for discharge Short Term Goals: Ability to maintain clinical measurements within normal limits will improve Compliance with prescribed medications will improve  Medication Management: Evaluate patient's response, side effects, and tolerance of medication regimen.  Therapeutic Interventions: 1 to 1 sessions, Unit Group sessions and Medication administration.  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)  Long Term Goal(s): Improvement in symptoms so as ready for discharge  Short Term Goals: Ability to maintain clinical measurements within normal limits will improve Compliance with prescribed medications will improve  Medication Management: Evaluate patient's response, side effects, and tolerance of medication regimen.  Therapeutic Interventions: 1 to 1 sessions, Unit Group sessions and Medication administration.  Evaluation of Outcomes: Progressing  RN Treatment Plan for Primary Diagnosis: Major depressive disorder, recurrent episode with mood-congruent psychotic features (HCC) Long Term Goal(s): Knowledge of disease and therapeutic regimen to maintain health will improve  Short Term Goals: Compliance with prescribed medications will improve  Medication Management: RN will administer medications as ordered by provider,  will assess and evaluate patient's response and provide education to patient for prescribed medication. RN will report any adverse and/or side effects to prescribing provider.  Therapeutic Interventions: 1 on 1 counseling sessions, Psychoeducation, Medication administration, Evaluate responses to treatment, Monitor vital signs and CBGs as ordered, Perform/monitor CIWA, COWS, AIMS and Fall Risk screenings as ordered, Perform wound care treatments as ordered.  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 Short Term Goals: Engage patient in aftercare planning with referrals and resources, Increase emotional regulation, Identify triggers associated with mental health/substance abuse issues and Increase skills for wellness and recovery  Therapeutic Interventions: Assess for all discharge needs, 1 to 1 time with Social worker, Explore available resources and support systems, Assess for adequacy in community support network, Educate family and significant other(s) on suicide prevention, Complete Psychosocial Assessment, Interpersonal group therapy.  Evaluation of Outcomes: Progressing  Progress in Treatment: Attending groups: No Participating in groups: No Taking medication as prescribed: Yes, MD continues to assess for medication changes as needed Toleration medication: Yes, no side effects reported at this time Family/Significant other contact made: No, CSW attempting contact with pt's mother Patient understands diagnosis: Developing insight Discussing patient identified problems/goals with staff: Yes Medical problems stabilized or resolved: Yes Denies suicidal/homicidal ideation: Pt endorses passive SI Issues/concerns per  patient self-inventory: None Other: N/A  New problem(s) identified: None identified at this time.   New Short Term/Long Term Goal(s): None identified at this time.   Discharge Plan or Barriers: Pt [facility] return home and [facility]  Reason for Continuation of Hospitalization:  Anxiety  Depression Medication stabilization Suicidal ideation  Estimated Length of Stay: 1-3 days; Estimated discharge date 07/03/17  Attendees: Patient: 06/30/2017 8:58 AM  Physician: Dr. Jama Flavorsobos 06/30/2017 8:58 AM  Nursing: Boyd KerbsPenny, RN; MilbankPatty, RN 06/30/2017 8:58 AM  RN Care Manager: Onnie BoerJennifer Clark, RN 06/30/2017 8:58 AM  Social Worker: Donnelly StagerLynn Kallen Mccrystal, LCSWA 06/30/2017 8:58 AM  Recreational Therapist:  06/30/2017 8:58 AM  Other: Armandina StammerAgnes Nwoko, NP 06/30/2017 8:58 AM  Other:  06/30/2017 8:58 AM  Other: 06/30/2017 8:58 AM   Scribe for Treatment Team: Jonathon JordanLynn B Yarima Penman, MSW,LCSWA 06/30/2017 8:58 AM

## 2017-07-01 DIAGNOSIS — F333 Major depressive disorder, recurrent, severe with psychotic symptoms: Secondary | ICD-10-CM

## 2017-07-01 DIAGNOSIS — F17210 Nicotine dependence, cigarettes, uncomplicated: Secondary | ICD-10-CM

## 2017-07-01 MED ORDER — NICOTINE 21 MG/24HR TD PT24: 21.0000 mg | MEDICATED_PATCH | Freq: Every day | TRANSDERMAL | 0 refills | Status: DC | PRN

## 2017-07-01 MED ORDER — LAMOTRIGINE 25 MG PO TABS: 50.0000 mg | ORAL_TABLET | Freq: Every day | ORAL | 0 refills | Status: DC

## 2017-07-01 MED ORDER — OLANZAPINE 15 MG PO TABS: 15.0000 mg | ORAL_TABLET | Freq: Every day | ORAL | 0 refills | Status: DC
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
## 2017-07-01 NOTE — Progress Notes (Signed)
Data. Patient denies SI/HI/AVH.   Patient interacting well with staff and other patients.  Action. Emotional support and encouragement offered. Education provided on medication, indications and side effect. Q 15 minute checks done for safety. Response. Safety on the unit maintained through 15 minute checks.  Medications taken as prescribed. Attended groups. Remained calm and appropriate through out shift.  Pt. discharged to lobby.  Belongings sheet reviewed and signed by pt. and all belongings sent home. Paperwork reviewed and pt. able to verbalize understanding of education. Pt. in no current distress and ambulatory. 

## 2017-07-01 NOTE — [facility] Suicide Risk Assessment (Signed)
[facility] INPATIENT:  Family/Significant Other Suicide Prevention Education  Suicide Prevention Education:  Education Completed:   Stephanie HamperLinda Troxler (mother [phone]) has been identified by the patient as the family member/significant other with whom the patient will be residing, and identified as the person(s) who will aid the patient in the event of a mental health crisis (suicidal ideations/suicide attempt).  With written consent from the patient, the family member/significant other has been provided the following suicide prevention education, prior to the and/or following the discharge of the patient.  The suicide prevention education provided includes the following:  Suicide risk factors  Suicide prevention and interventions  National Suicide Hotline telephone number  [facility] assessment telephone number  [facility] Emergency Assistance 911  [facility] and/or Residential Mobile Crisis Unit telephone number  Request made of family/significant other to:  Remove weapons (e.g., guns, rifles, knives), all items previously/currently identified as safety concern.    Remove drugs/medications (over-the-counter, prescriptions, illicit drugs), all items previously/currently identified as a safety concern.  The family member/significant other verbalizes understanding of the suicide prevention education information provided.  The family member/significant other agrees to remove the items of safety concern listed above.  MOTHER REPORTS THERE ARE NO FIREARMS IN THE HOME.  SHE STATES PT IS IN GOOD SHAPE, IS ALWAYS WILLING TO SAY SHE DOES NOT FEEL "RIGHT" WHEN NECESSARY.  MOTHER ALSO ASKS FOR INFORMATION ABOUT SUPPORT GROUPS FOR BIPOLAR DISORDER, WHICH ARE LOOKED UP AND GIVEN.  SPE BROCHURE GIVEN FOR BOTH PT AND MOTHER.  Carloyn JaegerMareida J Grossman-Orr 07/01/2017, 9:24 AM

## 2017-07-01 NOTE — Progress Notes (Signed)
D: Pt presents with anxious affect and mood.  She discussed how she hopes to discharge tomorrow so she can be home with her children.  Pt reports she feels safe to discharge.  Pt denies SI/HI, denies hallucinations, denies pain.  Pt has been visible in milieu interacting with peers and staff appropriately.  Pt attended evening group.    A: Introduced self to pt.  Actively listened to pt and offered support and encouragement. Medications administered per order.  Q15 minute safety checks maintained.  R: Pt is safe on the unit.  Pt is compliant with medications.  Pt verbally contracts for safety.  Will continue to monitor and assess.

## 2017-07-01 NOTE — [facility] Suicide Risk Assessment (Signed)
[facility]   Principal Problem: Major depressive disorder, recurrent episode with mood-congruent psychotic features [facility](HCC) Discharge Diagnoses:  Patient Active Problem List   Diagnosis Date Noted  . MDD (major depressive disorder), recurrent severe, without psychosis (HCC) [F33.2] 06/28/2017  . Major depressive disorder, recurrent episode with mood-congruent psychotic features (HCC) [F33.3] 06/07/2017    Total Time spent with patient: 30 minutes  Musculoskeletal: Strength & Muscle Tone: within normal limits Gait & Station: normal Patient leans: N/A  Psychiatric Specialty Exam: ROS  Blood pressure 127/77, pulse (!) 103, temperature 98.7 F (37.1 C), temperature source Oral, resp. rate 16, height 5\' 4"  (1.626 m), weight 72.6 kg (160 lb), unknown if currently breastfeeding.Body mass index is 27.46 kg/m.  General Appearance: Casual and Fairly Groomed  Eye Contact::  Good  Speech:  Clear and Coherent409  Volume:  Normal  Mood:  Anxious  Affect:  Appropriate  Thought Process:  Goal Directed  Orientation:  Full (Time, Place, and Person)  Thought Content:  Logical and Rumination  Suicidal Thoughts:  No  Homicidal Thoughts:  No  Memory:  Immediate;   Good Recent;   Good Remote;   Good  Judgement:  Good  Insight:  Good  Psychomotor Activity:  Normal  Concentration:  Good  Recall:  Good  Fund of Knowledge:Good  Language: Good  Akathisia:  No  Handed:  Right  AIMS (if indicated):     Assets:  Communication Skills Desire for Improvement Housing Resilience Social Support Talents/Skills Transportation  Sleep:  Number of Hours: 5.5  Cognition: WNL  ADL's:  Intact   Mental Status Per Nursing Assessment::   On Admission:  Suicidal ideation indicated by patient  Demographic Factors:  NA  Loss Factors: NA  Historical Factors: Impulsivity  Risk Reduction Factors:   Responsible for children under 38 years of age, Sense of responsibility to  family, Employed, Living with another person, especially a relative, Positive social support, Positive therapeutic relationship and Positive coping skills or problem solving skills  Continued Clinical Symptoms:  Depression:   Impulsivity Insomnia Severe  Cognitive Features That Contribute To Risk:  None    Suicide Risk:  Minimal: No identifiable suicidal ideation.  Patients presenting with no risk factors but with morbid ruminations; may be classified as minimal risk based on the severity of the depressive symptoms  Follow-up Information    Stephanie Petty Follow up on 07/04/2017.   Why:  Medication management appointment 7/31 with Stephanie Petty at 3:30pm. Stephanie Petty will also be able to schedule your therapy appointment.  Contact information: [address], [address] [place], [place] [address] P: [phone] F: [phone]          Plan Of Care/Follow-up recommendations:  Activity:  unchanged from the past Diet:  normal Other:  patient will follow-up with physician assistant [place]  Stephanie Petty., MD 07/01/2017, 9:13 AM

## 2017-07-01 NOTE — Progress Notes (Signed)
Adult Psychoeducational Group Note  Date:  07/01/2017 Time:  12:08 AM  Group Topic/Focus:  Wrap-Up Group:   The focus of this group is to help patients review their daily goal of treatment and discuss progress on daily workbooks.  Participation Level:  Active  Participation Quality:  Appropriate and Attentive  Affect:  Appropriate  Cognitive:  Alert, Appropriate and Oriented  Insight: Appropriate  Engagement in Group:  Engaged  Modes of Intervention:  Discussion and Education  Additional Comments:  Pt attended and participated in group. Pt stated the doctor explained her diagnosis to her today and pt stated this helps her understand her feelings. Pt stated the best part of her day was going outside and rated her day a 8/10. Pt also stated that she feels she is ready to leave tomorrow.   Berlin Hunuttle, Theona Muhs M 07/01/2017, 12:08 AM

## 2018-02-05 ENCOUNTER — Encounter (HOSPITAL_COMMUNITY): Payer: Self-pay | Admitting: Behavioral Health

## 2018-02-05 ENCOUNTER — Inpatient Hospital Stay (HOSPITAL_COMMUNITY)
Admission: RE | Admit: 2018-02-05 | Discharge: 2018-02-15 | DRG: 885 | Disposition: A | Discharge status: Home / Self Care | Payer: BLUE CROSS/BLUE SHIELD | Attending: Psychiatry | Admitting: Psychiatry

## 2018-02-05 ENCOUNTER — Other Ambulatory Visit: Payer: Self-pay

## 2018-02-05 DIAGNOSIS — F39 Unspecified mood [affective] disorder: Secondary | ICD-10-CM | POA: Diagnosis present

## 2018-02-05 DIAGNOSIS — G4710 Hypersomnia, unspecified: Secondary | ICD-10-CM | POA: Diagnosis present

## 2018-02-05 DIAGNOSIS — R45851 Suicidal ideations: Secondary | ICD-10-CM | POA: Diagnosis present

## 2018-02-05 DIAGNOSIS — F332 Major depressive disorder, recurrent severe without psychotic features: Principal | ICD-10-CM | POA: Diagnosis present

## 2018-02-05 DIAGNOSIS — Z811 Family history of alcohol abuse and dependence: Secondary | ICD-10-CM | POA: Diagnosis not present

## 2018-02-05 DIAGNOSIS — Z888 Allergy status to other drugs, medicaments and biological substances status: Secondary | ICD-10-CM

## 2018-02-05 DIAGNOSIS — G4700 Insomnia, unspecified: Secondary | ICD-10-CM | POA: Diagnosis present

## 2018-02-05 DIAGNOSIS — Z975 Presence of (intrauterine) contraceptive device: Secondary | ICD-10-CM | POA: Diagnosis not present

## 2018-02-05 DIAGNOSIS — R0981 Nasal congestion: Secondary | ICD-10-CM | POA: Diagnosis present

## 2018-02-05 DIAGNOSIS — Z91410 Personal history of adult physical and sexual abuse: Secondary | ICD-10-CM | POA: Diagnosis not present

## 2018-02-05 DIAGNOSIS — F419 Anxiety disorder, unspecified: Secondary | ICD-10-CM | POA: Diagnosis present

## 2018-02-05 DIAGNOSIS — Z79899 Other long term (current) drug therapy: Secondary | ICD-10-CM | POA: Diagnosis not present

## 2018-02-05 DIAGNOSIS — Z818 Family history of other mental and behavioral disorders: Secondary | ICD-10-CM

## 2018-02-05 DIAGNOSIS — F17210 Nicotine dependence, cigarettes, uncomplicated: Secondary | ICD-10-CM | POA: Diagnosis present

## 2018-02-05 DIAGNOSIS — R450 Nervousness: Secondary | ICD-10-CM | POA: Diagnosis not present

## 2018-02-05 MED ORDER — HYDROXYZINE HCL 25 MG PO TABS
25.0000 mg | ORAL_TABLET | Freq: Four times a day (QID) | ORAL | Status: DC | PRN
  Administered 2018-02-05 – 2018-02-06 (×2): 25 mg via ORAL
  Filled 2018-02-05 (×4): qty 1

## 2018-02-05 MED ORDER — ACETAMINOPHEN 325 MG PO TABS: 650.0000 mg | ORAL_TABLET | Freq: Four times a day (QID) | ORAL | Status: DC | PRN

## 2018-02-05 MED ORDER — TRAZODONE HCL 50 MG PO TABS
50.0000 mg | ORAL_TABLET | Freq: Every evening | ORAL | Status: DC | PRN
  Filled 2018-02-05 (×2): qty 1

## 2018-02-05 MED ORDER — MAGNESIUM HYDROXIDE 400 MG/5ML PO SUSP: 30.0000 mL | Freq: Every day | ORAL | Status: DC | PRN

## 2018-02-05 MED ORDER — NICOTINE POLACRILEX 2 MG MT GUM
2.0000 mg | CHEWING_GUM | OROMUCOSAL | Status: DC | PRN
  Administered 2018-02-05: 2 mg via ORAL

## 2018-02-05 MED ORDER — ALUM & MAG HYDROXIDE-SIMETH 200-200-20 MG/5ML PO SUSP: 30.0000 mL | ORAL | Status: DC | PRN

## 2018-02-05 NOTE — Tx Team (Signed)
Initial Treatment Plan 02/05/2018 2:38 PM Stephanie Petty [id number]    PATIENT STRESSORS: Marital or family conflict Medication change or noncompliance   PATIENT STRENGTHS: Ability for insight Average or above average intelligence Capable of independent living Communication skills General fund of knowledge Motivation for treatment/growth   PATIENT IDENTIFIED PROBLEMS: Depression Suicidal thoughts "I need to get my medications changed"                     DISCHARGE CRITERIA:  Ability to meet basic life and health needs Improved stabilization in mood, thinking, and/or behavior Reduction of life-threatening or endangering symptoms to within safe limits Verbal commitment to aftercare and medication compliance  PRELIMINARY DISCHARGE PLAN: Attend aftercare/continuing care group Return to previous living arrangement  PATIENT/FAMILY INVOLVEMENT: This treatment plan has been presented to and reviewed with the patient, Stephanie Petty, and/or family member, .  The patient and family have been given the opportunity to ask questions and make suggestions.  Rebecka Oelkers, [place], [place] 02/05/2018, 2:38 PM

## 2018-02-05 NOTE — Progress Notes (Signed)
Urine cup given to patient with instructions. Patient verbalizes understanding.

## 2018-02-05 NOTE — Progress Notes (Signed)
D: Pt was in bed in her room upon initial approach.  Pt presents with depressed affect and mood.  She states "I'm sleepy.  They gave me some Vistaril earlier."  Her goal is "just to sleep through the night."  Pt denies SI/HI, denies hallucinations, denies pain.  Pt has stayed in her room for the majority of the night and she did not attend evening group.   A: Introduced self to pt.  Actively listened to pt and offered support and encouragement. Q15 minute safety checks maintained.  R: Pt is safe on the unit.  Pt reports she will inform staff of needs and concerns.  Pt verbally contracts for safety.  Will continue to monitor and assess.

## 2018-02-05 NOTE — Plan of Care (Signed)
  Progressing Safety: Periods of time without injury will increase 02/05/2018 2249 - Progressing by Arrie Aranhurch, Thora Scherman J, RN Note Pt has not harmed self or others tonight.  She denies SI/HI and verbally contracts for safety.
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
## 2018-02-05 NOTE — Progress Notes (Signed)
Did not attend group 

## 2018-02-05 NOTE — Progress Notes (Signed)
Stephanie Petty is a 37 year old female pt admitted as voluntary pt after presenting as a walk-in. She spoke about how she has been feeling depressed and suicidal and does endorse passive SI but able to contract for safety while in the hospital. She denies any substance abuse issues and reports that she does take her medications but feels that they are not helpful and feels she needs her medications changed. Stephanie Petty was oriented to the unit and safety maintained.

## 2018-02-05 NOTE — Progress Notes (Signed)
Assumed care of patient from admitting nurse, Brook. Patient has been observed up and visible in the milieu. Settling in with peers in the dayroom. Has complained of nicotine cravings and anxiety. Medicated with nicorette and vistaril per orders. Emotional support offered. On reassess, patient is calmer and reports decreased craving. Currently resting in bed. Verbal contract for safety remains in place.

## 2018-02-05 NOTE — H&P (Addendum)
Behavioral Health Medical Screening Exam  Stephanie Petty is an 37 y.o. female with history of depression. She presents as a walk in today with her mother. The patient reports having trouble recently functioning due to "low motivation, racing thoughts, so depressed." She has started having suicidal thoughts again and is unable to contract for her safety. Accepted for inpatient psychiatric admission.   Total Time spent with patient: 20 minutes  Psychiatric Specialty Exam: Physical Exam  Constitutional: She is oriented to person, place, and time. She appears well-developed and well-nourished.  HENT:  Head: Normocephalic.  Neck: Normal range of motion.  Cardiovascular: Normal rate, regular rhythm, normal heart sounds and intact distal pulses.  Respiratory: Effort normal and breath sounds normal.  GI: Soft. Bowel sounds are normal.  Musculoskeletal: Normal range of motion.  Neurological: She is alert and oriented to person, place, and time.  Skin: Skin is warm and dry.    ROS  Blood pressure (!) 116/56, pulse 88, temperature 98.1 F (36.7 C), resp. rate 18, SpO2 100 %, unknown if currently breastfeeding.There is no height or weight on file to calculate BMI.  General Appearance: Casual  Eye Contact:  Good  Speech:  Clear and Coherent  Volume:  Normal  Mood:  Anxious and Depressed  Affect:  Congruent  Thought Process:  Coherent and Goal Directed  Orientation:  Full (Time, Place, and Person)  Thought Content:  Severe depressive symptoms  Suicidal Thoughts:  Yes.  with intent/plan  Homicidal Thoughts:  No  Memory:  Immediate;   Good Recent;   Good Remote;   Good  Judgement:  Fair  Insight:  Present  Psychomotor Activity:  Restlessness  Concentration: Concentration: Good and Attention Span: Good  Recall:  Good  Fund of Knowledge:Good  Language: Good  Akathisia:  No  Handed:  Right  AIMS (if indicated):     Assets:  Communication Skills Desire for Improvement Financial  Resources/Insurance Housing Intimacy Leisure Time Physical Health Resilience Social Support  Sleep:       Musculoskeletal: Strength & Muscle Tone: within normal limits Gait & Station: normal Patient leans: N/A  Blood pressure (!) 116/56, pulse 88, temperature 98.1 F (36.7 C), resp. rate 18, SpO2 100 %, unknown if currently breastfeeding.  Recommendations:  Based on my evaluation the patient does not appear to have an emergency medical condition.  Fransisca KaufmannAVIS, LAURA, NP 02/05/2018, 1:43 PM   Agree with NP Assessment

## 2018-02-05 NOTE — Progress Notes (Signed)
Adult Psychoeducational Group Note  Date:  02/05/2018 Time:  6:53 PM  Group Topic/Focus:  Healthy Communication:   The focus of this group is to discuss communication, barriers to communication, as well as healthy ways to communicate with others.  Participation Level:  Active  Participation Quality:  Appropriate  Affect:  Appropriate  Cognitive:  Alert and Appropriate  Insight: Appropriate, Good and Improving  Engagement in Group:  Engaged  Modes of Intervention:  Activity and Discussion  Additional Comments:  Pt did participate in all group activities and discussions today in group.  Havilah Topor R Tamla Winkels 02/05/2018, 6:53 PM

## 2018-02-06 DIAGNOSIS — Z811 Family history of alcohol abuse and dependence: Secondary | ICD-10-CM

## 2018-02-06 DIAGNOSIS — R450 Nervousness: Secondary | ICD-10-CM

## 2018-02-06 DIAGNOSIS — Z818 Family history of other mental and behavioral disorders: Secondary | ICD-10-CM

## 2018-02-06 DIAGNOSIS — F332 Major depressive disorder, recurrent severe without psychotic features: Principal | ICD-10-CM

## 2018-02-06 DIAGNOSIS — F419 Anxiety disorder, unspecified: Secondary | ICD-10-CM

## 2018-02-06 LAB — COMPREHENSIVE METABOLIC PANEL
ALK PHOS: 49 U/L (ref 38–126)
ALT: 9 U/L — ABNORMAL LOW (ref 14–54)
AST: 14 U/L — AB (ref 15–41)
Albumin: 4.5 g/dL (ref 3.5–5.0)
Anion gap: 9 (ref 5–15)
BILIRUBIN TOTAL: 0.6 mg/dL (ref 0.3–1.2)
BUN: 18 mg/dL (ref 6–20)
CALCIUM: 9.4 mg/dL (ref 8.9–10.3)
CO2: 30 mmol/L (ref 22–32)
CREATININE: 1.06 mg/dL — AB (ref 0.44–1.00)
Chloride: 105 mmol/L (ref 101–111)
GFR calc Af Amer: >60 mL/min (ref >60)
GLUCOSE: 92 mg/dL (ref 65–99)
Potassium: 4.2 mmol/L (ref 3.5–5.1)
Sodium: 144 mmol/L (ref 135–145)
TOTAL PROTEIN: 7.3 g/dL (ref 6.5–8.1)

## 2018-02-06 LAB — CBC
HEMATOCRIT: 47.7 % — AB (ref 36.0–46.0)
HEMOGLOBIN: 15.7 g/dL — AB (ref 12.0–15.0)
MCH: 31.3 pg (ref 26.0–34.0)
MCHC: 32.9 g/dL (ref 30.0–36.0)
MCV: 95.2 fL (ref 78.0–100.0)
Platelets: 248 10*3/uL (ref 150–400)
RBC: 5.01 MIL/uL (ref 3.87–5.11)
RDW: 13 % (ref 11.5–15.5)
WBC: 7.2 10*3/uL (ref 4.0–10.5)

## 2018-02-06 LAB — RAPID URINE DRUG SCREEN, HOSP PERFORMED
Amphetamines: NOT DETECTED
BENZODIAZEPINES: NOT DETECTED
Barbiturates: NOT DETECTED
COCAINE: NOT DETECTED
Opiates: NOT DETECTED
Tetrahydrocannabinol: NOT DETECTED

## 2018-02-06 LAB — PREGNANCY, URINE: PREG TEST UR: NEGATIVE

## 2018-02-06 LAB — TSH: TSH: 0.898 u[IU]/mL (ref 0.350–4.500)

## 2018-02-06 MED ORDER — ESCITALOPRAM OXALATE 5 MG PO TABS
5.0000 mg | ORAL_TABLET | Freq: Every day | ORAL | Status: DC
  Administered 2018-02-06 – 2018-02-08 (×3): 5 mg via ORAL
  Filled 2018-02-06 (×6): qty 1

## 2018-02-06 MED ORDER — ARIPIPRAZOLE 5 MG PO TABS
5.0000 mg | ORAL_TABLET | Freq: Every day | ORAL | Status: DC
  Administered 2018-02-06 – 2018-02-08 (×3): 5 mg via ORAL
  Filled 2018-02-06 (×6): qty 1

## 2018-02-06 NOTE — [facility] Suicide Risk Assessment (Signed)
[facility]   Nursing information obtained from:   patient and chart  Demographic factors:   37 year old female, has two children, employed  Current Mental Status:   see below Loss Factors:   no specific stressors identified  Historical Factors:   history of depression, history of prior psychiatric medications  Risk Reduction Factors:   sense of responsibility to her children, employed   Total Time spent with patient: 45 minutes  Principal Problem:  MDD Diagnosis:   Patient Active Problem List   Diagnosis Date Noted  . MDD (major depressive disorder), recurrent episode, severe (HCC) [F33.2] 02/05/2018  . MDD (major depressive disorder), recurrent severe, without psychosis (HCC) [F33.2] 06/28/2017  . 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  World Science writerHealth Organization [facility](WHO). Score between 0-7:  no or low risk or alcohol related problems. Score between 8-15:  moderate risk of alcohol related problems. Score between 16-19:  high risk of alcohol related problems. Score 20 or above:  warrants further diagnostic evaluation for alcohol dependence and treatment.   CLINICAL FACTORS:  37 year old female, presents for worsening depression, suicidal ideations of overdosing . History of prior psychiatric admissions for mood disorder last year . States she does not feel current medications are helping .   Psychiatric Specialty Exam: Physical Exam  ROS  Blood pressure 100/65, pulse (!) 114, temperature 98.1 F (36.7 C), temperature source Oral, resp. rate 18, height 5' 3.5" (1.613 m), weight 68.5 kg (151 lb), SpO2 100 %, unknown if currently breastfeeding.Body mass index is 26.33 kg/m.  See admit note MSE      COGNITIVE FEATURES THAT CONTRIBUTE TO RISK:  Closed-mindedness and Loss of executive function    SUICIDE RISK:   Moderate:  Frequent suicidal ideation with limited intensity, and duration, some specificity in terms of plans, no associated intent, good self-control, limited dysphoria/symptomatology, some risk factors present, and identifiable protective factors, including available and accessible social support.  PLAN OF CARE: Patient will be admitted to inpatient psychiatric unit for stabilization and safety. Will provide and encourage milieu participation. Provide medication management and maked adjustments as needed.  Will follow daily.    I certify that inpatient services furnished can reasonably be expected to improve the patient's condition.   Craige CottaFernando A Cobos, MD 02/06/2018, 5:16 PM

## 2018-02-06 NOTE — [facility] Group Notes (Signed)
LCSW Group Therapy Note 02/06/2018 12:29 PM  Type of Therapy and Topic: Group Therapy: Avoiding Self-Sabotaging and Enabling Behaviors  Participation Level: Did Not Attend  Description of Group:  In this group, patients will learn how to identify obstacles, self-sabotaging and enabling behaviors, as well as: what are they, why do we do them and what needs these behaviors meet. Discuss unhealthy relationships and how to have positive healthy boundaries with those that sabotage and enable. Explore aspects of self-sabotage and enabling in yourself and how to limit these self-destructive behaviors in everyday life.  Therapeutic Goals: 1. Patient will identify one obstacle that relates to self-sabotage and enabling behaviors 2. Patient will identify one personal self-sabotaging or enabling behavior they did prior to admission 3. Patient will state a plan to change the above identified behavior 4. Patient will demonstrate ability to communicate their needs through discussion and/or role play.   Summary of Patient Progress:  Invited, chose not to attend.    Therapeutic Modalities:  Cognitive Behavioral Therapy Person-Centered Therapy Motivational Interviewing   Rune Mendez LCSWA Clinical Social Worker   

## 2018-02-06 NOTE — Progress Notes (Signed)
D    Pt is depressed and sad   She does brighten some on approach   Pt attends some groups  A   Verbal support given   Monitor for medication effectiveness   Q 15 min checks R    Pt is safe at present time

## 2018-02-06 NOTE — Plan of Care (Signed)
Patient is safe and free from injury.  Routine safety checks maintained every 15 minutes. 
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
## 2018-02-06 NOTE — [facility] Group Notes (Signed)
[facility] Group Notes:  (Nursing/MHT/Case Management/Adjunct)  Date:  02/06/2018  Time:  1515  Type of Therapy:  Nurse Education - Therapeutic Activity  Participation Level:  Active  Participation Quality:  Appropriate  Affect:  Appropriate  Cognitive:  Alert  Insight:  Improving  Engagement in Group:  Engaged  Modes of Intervention:  Activity  Summary of Progress/Problems: Patient attended group and participated in therapeutic ball activity. Shared with peers, brightened in affect and mood.   Merian CapronFriedman, Cristyn Crossno [facility] 02/06/2018, 5:12 PM
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
## 2018-02-06 NOTE — Progress Notes (Signed)
Dar Note: Patient presents with flat affect and depressed mood.  Spent most of this shift in her room sleeping.  Denies suicidal thoughts, auditory and visual hallucinations.  Requested and received Vistaril for complain of anxiety with good effect.  Routine safety checks maintained every 15 minutes.  Minimal interaction with staff and peers.  Patient is safe on the unit.

## 2018-02-07 DIAGNOSIS — G4700 Insomnia, unspecified: Secondary | ICD-10-CM

## 2018-02-07 DIAGNOSIS — R45851 Suicidal ideations: Secondary | ICD-10-CM

## 2018-02-07 DIAGNOSIS — F17210 Nicotine dependence, cigarettes, uncomplicated: Secondary | ICD-10-CM

## 2018-02-07 DIAGNOSIS — R0981 Nasal congestion: Secondary | ICD-10-CM

## 2018-02-07 DIAGNOSIS — Z975 Presence of (intrauterine) contraceptive device: Secondary | ICD-10-CM

## 2018-02-07 LAB — HEMOGLOBIN A1C
Hgb A1c MFr Bld: 4.8 % (ref 4.8–5.6)
MEAN PLASMA GLUCOSE: 91.06 mg/dL

## 2018-02-07 LAB — LIPID PANEL
CHOLESTEROL: 132 mg/dL (ref 0–200)
HDL: 39 mg/dL — AB (ref >40)
LDL Cholesterol: 80 mg/dL (ref 0–99)
TRIGLYCERIDES: 67 mg/dL (ref <150)
Total CHOL/HDL Ratio: 3.4 RATIO
VLDL: 13 mg/dL (ref 0–40)

## 2018-02-07 MED ORDER — FLUTICASONE PROPIONATE 50 MCG/ACT NA SUSP
2.0000 | Freq: Two times a day (BID) | NASAL | Status: DC
  Administered 2018-02-07: 2 via NASAL
  Filled 2018-02-07 (×3): qty 16

## 2018-02-07 NOTE — [facility] Group Notes (Signed)
[facility] Group Notes:  (Nursing/MHT/Case Management/Adjunct)  Date:  02/07/2018  Time:  4:15 PM  Type of Therapy:  Psychoeducational Skills  Participation Level:  Active  Participation Quality:  Appropriate and Attentive  Affect:  Appropriate  Cognitive:  Alert and Appropriate  Insight:  Appropriate  Engagement in Group:  Engaged  Modes of Intervention:  Activity, Discussion and Education  Summary of Progress/Problems: Patient attended group and participated. 

## 2018-02-07 NOTE — [facility] Group Notes (Signed)
[facility] [employer] Group Therapy 02/07/2018 1:15pm  Type of Therapy: Mental Health Association Presentation  Participation Level: Active  Participation Quality: Attentive  Affect: Appropriate  Cognitive: Oriented  Insight: Developing/Improving  Engagement in Therapy: Engaged  Modes of Intervention: Discussion, Education and Socialization  Summary of Progress/Problems: Mental [employer] Association (MHA) Speaker came to talk about his personal journey with mental health. The pt processed ways by which to relate to the speaker. MHA speaker provided handouts and educational information pertaining to groups and services offered by the MHA. Pt was engaged in speaker's presentation and was receptive to resources provided.    Stephanie Petty N Smart, LCSW 02/07/2018 12:52 PM  

## 2018-02-07 NOTE — Progress Notes (Signed)
Patient's self inventory sheet, patient sleeps good, sleep medication helpful.  Good appetite, normal energy level, good concentration.  Rated depression and hopeless 5, anxiety 6.  Denied withdrawals, checked runny nose.  Denied physical problems.  Denied SI.  Denied physical pain.  Goal is get better every day.   No discharge plans.
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
## 2018-02-07 NOTE — Tx Team (Signed)
Interdisciplinary Treatment and Diagnostic Plan Update  02/07/2018 Time of Session: 1117 Stephanie Petty MRN: [id number]  Principal Diagnosis: <principal problem not specified>  Secondary Diagnoses: Active Problems:   MDD (major depressive disorder), recurrent episode, severe ([facility])   Current Medications:  Current Facility-Administered Medications  Medication Dose Route Frequency Provider Last Rate Last Dose  . acetaminophen (TYLENOL) tablet 650 mg  650 mg Oral Q6H PRN Thermon Leylandavis, Laura A, NP      . alum & mag hydroxide-simeth (MAALOX/MYLANTA) 200-200-20 MG/5ML suspension 30 mL  30 mL Oral Q4H PRN Fransisca Kaufmannavis, Laura A, NP      . ARIPiprazole (ABILIFY) tablet 5 mg  5 mg Oral Daily Cobos, Rockey SituFernando A, MD   5 mg at 02/07/18 0817  . escitalopram (LEXAPRO) tablet 5 mg  5 mg Oral Daily Cobos, Rockey SituFernando A, MD   5 mg at 02/07/18 0817  . hydrOXYzine (ATARAX/VISTARIL) tablet 25 mg  25 mg Oral Q6H PRN Thermon Leylandavis, Laura A, NP   25 mg at 02/06/18 1119  . magnesium hydroxide (MILK OF MAGNESIA) suspension 30 mL  30 mL Oral Daily PRN Fransisca Kaufmannavis, Laura A, NP      . nicotine polacrilex (NICORETTE) gum 2 mg  2 mg Oral PRN Georgiann CockerIzediuno, Vincent A, MD   2 mg at 02/05/18 1452  . traZODone (DESYREL) tablet 50 mg  50 mg Oral QHS PRN Thermon Leylandavis, Laura A, NP       PTA Medications: Medications Prior to Admission  Medication Sig Dispense Refill Last Dose  . busPIRone (BUSPAR) 10 MG tablet Take 10 mg by mouth daily.   Past Week at Unknown time  . risperiDONE (RISPERDAL) 2 MG tablet Take 2 mg by mouth at bedtime.   Past Week at Unknown time    Patient Stressors: Marital or family conflict Medication change or noncompliance  Patient Strengths: Ability for insight Average or above average intelligence Capable of independent living Communication skills General fund of knowledge Motivation for treatment/growth  Treatment Modalities: Medication Management, Group therapy, Case management,  1 to 1 session with clinician, Psychoeducation,  Recreational therapy.   Physician Treatment Plan for Primary Diagnosis: <principal problem not specified> Long Term Goal(s): Improvement in symptoms so as ready for discharge Improvement in symptoms so as ready for discharge   Short Term Goals: Ability to identify changes in lifestyle to reduce recurrence of condition will improve Ability to maintain clinical measurements within normal limits will improve Ability to identify changes in lifestyle to reduce recurrence of condition will improve Ability to maintain clinical measurements within normal limits will improve  Medication Management: Evaluate patient's response, side effects, and tolerance of medication regimen.  Therapeutic Interventions: 1 to 1 sessions, Unit Group sessions and Medication administration.  Evaluation of Outcomes: Progressing  Physician Treatment Plan for Secondary Diagnosis: Active Problems:   MDD (major depressive disorder), recurrent episode, severe ([facility])  Long Term Goal(s): Improvement in symptoms so as ready for discharge Improvement in symptoms so as ready for discharge   Short Term Goals: Ability to identify changes in lifestyle to reduce recurrence of condition will improve Ability to maintain clinical measurements within normal limits will improve Ability to identify changes in lifestyle to reduce recurrence of condition will improve Ability to maintain clinical measurements within normal limits will improve     Medication Management: Evaluate patient's response, side effects, and tolerance of medication regimen.  Therapeutic Interventions: 1 to 1 sessions, Unit Group sessions and Medication administration.  Evaluation of Outcomes: Progressing   RN Treatment Plan for Primary  Diagnosis: <principal problem not specified> Long Term Goal(s): Knowledge of disease and therapeutic regimen to maintain health will improve  Short Term Goals: Ability to identify and develop effective coping behaviors will  improve and Compliance with prescribed medications will improve  Medication Management: RN will administer medications as ordered by provider, will assess and evaluate patient's response and provide education to patient for prescribed medication. RN will report any adverse and/or side effects to prescribing provider.  Therapeutic Interventions: 1 on 1 counseling sessions, Psychoeducation, Medication administration, Evaluate responses to treatment, Monitor vital signs and CBGs as ordered, Perform/monitor CIWA, COWS, AIMS and Fall Risk screenings as ordered, Perform wound care treatments as ordered.  Evaluation of Outcomes: Progressing   LCSW Treatment Plan for Primary Diagnosis: <principal problem not specified> Long Term Goal(s): Safe transition to appropriate next level of care at discharge, Engage patient in therapeutic group addressing interpersonal concerns.  Short Term Goals: Engage patient in aftercare planning with referrals and resources, Increase social support and Increase skills for wellness and recovery  Therapeutic Interventions: Assess for all discharge needs, 1 to 1 time with Social worker, Explore available resources and support systems, Assess for adequacy in community support network, Educate family and significant other(s) on suicide prevention, Complete Psychosocial Assessment, Interpersonal group therapy.  Evaluation of Outcomes: Progressing   Progress in Treatment: Attending groups: Yes. Participating in groups: Yes. Taking medication as prescribed: Yes. Toleration medication: Yes. Family/Significant other contact made: No, will contact:  mother Patient understands diagnosis: Yes. Discussing patient identified problems/goals with staff: Yes. Medical problems stabilized or resolved: Yes. Denies suicidal/homicidal ideation: Yes. Issues/concerns per patient self-inventory: No. Other: none  New problem(s) identified: No, Describe:  none  New Short Term/Long Term  Goal(s):Pt goal: "get stable and feel better."  Discharge Plan or Barriers:   Reason for Continuation of Hospitalization: Depression Medication stabilization  Estimated Length of Stay: 3-5 days. Attendees: Patient:Stephanie Petty 02/07/2018   Physician: Dr. Jama Flavors, MD 02/07/2018   Nursing: Quintella Reichert, RN 02/07/2018   RN Care Manager: 02/07/2018   Social Worker: Daleen Squibb, LCSW 02/07/2018   Recreational Therapist:  02/07/2018   Other:  02/07/2018   Other:  02/07/2018   Other: 02/07/2018         Scribe for Treatment Team: Lorri Frederick, LCSW 02/07/2018 11:46 AM

## 2018-02-07 NOTE — Progress Notes (Signed)
Adult Psychoeducational Group Note  Date:  02/07/2018 Time:  2:10 AM  Group Topic/Focus:  Wrap-Up Group:   The focus of this group is to help patients review their daily goal of treatment and discuss progress on daily workbooks.  Participation Level:  Did Not Attend  Participation Quality:  Did Not Attend  Affect:  Did Not Attend  Cognitive:  Did Not Attend  Insight: None  Engagement in Group:  None  Modes of Intervention:  Discussion, Education, Socialization and Support  Additional Comments:  Pt did not attend wrap up group.  Malachy MoanJeffers, Tanaisha Pittman S 02/07/2018, 2:10 AM

## 2018-02-07 NOTE — Plan of Care (Signed)
Nurse discussed depression, anxiety, coping skills with patient.  

## 2018-02-07 NOTE — Progress Notes (Signed)
D    Pt is depressed and sad   She does brighten some on approach   Pt attends some groups   Pt continues to have some on and off nausea and requested gingerale for same  A   Verbal support given   Monitor for medication effectiveness    Pt did not need medications for sleep   Q 15 min checks R    Pt is safe at present time

## 2018-02-07 NOTE — [facility] Suicide Risk Assessment (Addendum)
[facility] INPATIENT:  Family/Significant Other Suicide Prevention Education  Suicide Prevention Education:  Contact Attempts: Blaine HamperLinda Troxler, mother, [phone],has been identified by the patient as the family member/significant other with whom the patient will be residing, and identified as the person(s) who will aid the patient in the event of a mental health crisis.  With written consent from the patient, two attempts were made to provide suicide prevention education, prior to and/or following the patient's discharge.  We were unsuccessful in providing suicide prevention education.  A suicide education pamphlet was given to the patient to share with family/significant other.  Date and time of first attempt:02/07/18, 1013 Date and time of second attempt: 02/08/18, 1024  Stephanie FrederickWierda, Stephanie Lampi Jon, LCSW 02/07/2018, 10:13 AM

## 2018-02-07 NOTE — Progress Notes (Addendum)
D:  Patient denied SI and HI, contracts for safety.  Denied A/V hallucinations.  Denied pain. A:  Medications administered per MD orders.  Emotional support and encouragement given patient. R:  Safety maintained with 15 minute checks. Patient complains of head congestion today, discussed with MD.  Stephanie GranaFlonase given patient.  Patient refused prn tylenol, vistaril today.

## 2018-02-07 NOTE — [facility] Group Notes (Signed)
[facility] Group Notes:  (Nursing/MHT/Case Management/Adjunct)  Date:  02/07/2018  Time:  1:30 p.m.  Type of Therapy:  Psychoeducational Skills  Participation Level:  Active  Participation Quality:  Appropriate  Affect:  Appropriate  Cognitive:  Appropriate  Insight:  Appropriate  Engagement in Group:  Engaged  Modes of Intervention:  Education  Summary of Progress/Problems:  Patient responded appropriately and actively participated in this group.    Earline MayotteKnight, Minda Faas Shephard 02/07/2018, 3:34 PM

## 2018-02-08 MED ORDER — ARIPIPRAZOLE 10 MG PO TABS
10.0000 mg | ORAL_TABLET | Freq: Every day | ORAL | Status: DC
  Administered 2018-02-09 – 2018-02-12 (×4): 10 mg via ORAL
  Filled 2018-02-08 (×6): qty 1

## 2018-02-08 MED ORDER — ESCITALOPRAM OXALATE 10 MG PO TABS
10.0000 mg | ORAL_TABLET | Freq: Every day | ORAL | Status: DC
  Administered 2018-02-09 – 2018-02-12 (×4): 10 mg via ORAL
  Filled 2018-02-08 (×5): qty 1
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
## 2018-02-08 NOTE — Progress Notes (Signed)
D: Pt A & O X4. Visible in milieu on the telephone when approached. Presents with flat affects, brightens up on interactions. Rates her depression, hopelessness and anxiety all 5/10. Pt's goal this shift is to "stay positive". Reports she's sleeping good with good appetite, normal energy and good concentration level.  A: Scheduled medications administered as ordered with verbal education and effects monitored. Emotional support offered to pt this shift. Pt encouraged to voice concerns and comply with treatment regimen.  Safety checks maintained at Q 15 minutes intervals without outburst or self harm gestures thus far.   R: Pt receptive to care. Cooperative with unit routines. Attended some scheduled groups and was engaged. Went off unit for meals, returned without issues. POC continues for safety and mood stability.

## 2018-02-08 NOTE — [facility] Group Notes (Signed)
Adult Psychoeducational Group Note  Date:  02/08/2018 Time:  12:32 AM  Group Topic/Focus:  Wrap-Up Group:   The focus of this group is to help patients review their daily goal of treatment and discuss progress on daily workbooks.  Participation Level:  Active  Participation Quality:  Appropriate  Affect:  Appropriate  Cognitive:  Alert and Oriented  Insight: Appropriate  Engagement in Group:  Improving  Modes of Intervention:  Exploration and Support  Additional Comments:   Pt rated her day a 5. Pt verbalized something positive that happened was that she was able to talk to her babies.  Pt verbalized that today she was able to work on talking to the doctor about her medications and not getting sick. Pt verbalized one way to improve her communication with the doctor is to be completely honest about how she feels.   Josealfredo Adkins, Randal Bubaerri Lee 02/08/2018, 12:32 AM

## 2018-02-08 NOTE — Progress Notes (Signed)
Nutrition Education Note  Pt attended group focusing on general, healthful nutrition education.  RD emphasized the importance of eating regular meals and snacks throughout the day. Consuming sugar-free beverages and incorporating fruits and vegetables into diet when possible. Provided examples of healthy snacks. Patient encouraged to leave group with a goal to improve nutrition/healthy eating.   Diet Order: Diet regular Room service appropriate? Yes; Fluid consistency: Thin Pt is also offered choice of unit snacks mid-morning and mid-afternoon.  Pt is eating as desired.   If additional nutrition issues arise, please consult RD.    Zoriana Oats, MS, RD, LDN, CNSC Inpatient Clinical Dietitian Pager # [phone] After hours/weekend pager # [phone]     

## 2018-02-08 NOTE — [facility] Group Notes (Signed)
[facility] LCSW Group Therapy Note  Date/Time: 02/08/18, 1315  Type of Therapy/Topic:  Group Therapy:  Balance in Life  Participation Level:  minimal  Description of Group:    This group will address the concept of balance and how it feels and looks when one is unbalanced. Patients will be encouraged to process areas in their lives that are out of balance, and identify reasons for remaining unbalanced. Facilitators will guide patients utilizing problem- solving interventions to address and correct the stressor making their life unbalanced. Understanding and applying boundaries will be explored and addressed for obtaining  and maintaining a balanced life. Patients will be encouraged to explore ways to assertively make their unbalanced needs known to significant others in their lives, using other group members and facilitator for support and feedback.  Therapeutic Goals: 1. Patient will identify two or more emotions or situations they have that consume much of in their lives. 2. Patient will identify signs/triggers that life has become out of balance:  3. Patient will identify two ways to set boundaries in order to achieve balance in their lives:  4. Patient will demonstrate ability to communicate their needs through discussion and/or role plays  Summary of Patient Progress:Pt shared that mental/emotional and financial are out of balance in her life currently.  Pt minimally participated in group discussion about ways to address areas of life that are out of balance.  Pt was attentive during the group discussion.          Therapeutic Modalities:   Cognitive Behavioral Therapy Solution-Focused Therapy Assertiveness Training  Daleen SquibbGreg Georgenia Salim, [place]CS[place]
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
## 2018-02-08 NOTE — Progress Notes (Signed)
Adult Psychoeducational Group Note  Date:  02/08/2018 Time:  1600  Group Topic/Focus:  Coping With Mental Health Crisis:   The purpose of this group is to help patients identify strategies for coping with mental health crisis.  Group discusses possible causes of crisis and ways to manage them effectively.  Participation Level:  Active  Participation Quality:  Appropriate  Affect:  Appropriate  Cognitive:  Appropriate  Insight: Appropriate  Engagement in Group:  Engaged  Modes of Intervention:  Activity  Additional Comments:    Jumanah Hynson L 02/08/2018, 5:15 PM            

## 2018-02-08 NOTE — Progress Notes (Signed)
Psychoeducational Group Note  Date:  02/08/2018 Time    1516  Group Topic/Focus:  Crisis Planning:   The purpose of this group is to help patients create a crisis plan for use upon discharge or in the future, as needed.  Participation Level: Did Not Attend  Participation Quality:  Not Applicable  Affect:  Not Applicable  Cognitive:  Not Applicable  Insight:  Not Applicable  Engagement in Group: Not Applicable  Additional Comments:  Pt was asleep and did not attend group this morning.  Juliani Laduke E 02/08/2018, 3:17 PM

## 2018-02-08 NOTE — Progress Notes (Signed)
D    Pt is depressed and sad   She does brighten some on approach   Pt attends some groups   Pt continues to have some on and off nausea and requested gingerale for same   She did not request or require sleep medications tonight  A   Verbal support given   Monitor for medication effectiveness    Pt did not need medications for sleep   Q 15 min checks R    Pt is safe at present time

## 2018-02-09 NOTE — Progress Notes (Signed)
D Pt is observed balled up in a fetal position in an armchair in the 400 hall dayroom. She avoids eye  otnact with this Clinical research associatewriter as Clinical research associatewriter approaches her. She is pale, flat and depressed,.. A She did complete her daily assessment and  On this she wrote she deneid SI today and she rated her depression, hopelessness and anxeity " 4/4/4/", respectively. When writer aks hwer how she is doing today she repsonds " I'm holding my own...its about all I can do today...". R Safety is in place and therapeutic relationship fostered .

## 2018-02-09 NOTE — Progress Notes (Signed)
Adult Psychoeducational Group Note  Date:  02/09/2018 Time:  9:24 PM  Group Topic/Focus:  Wrap-Up Group:   The focus of this group is to help patients review their daily goal of treatment and discuss progress on daily workbooks.  Participation Level:  Active  Participation Quality:  Appropriate  Affect:  Appropriate  Cognitive:  Alert and Oriented  Insight: Good  Engagement in Group:  Engaged  Modes of Intervention:  Discussion  Additional Comments:  Pt rated her day a 6/10. Pt was working on her discharge plan, and she is supposed to leave Sunday. Pt is going to work on staying positive.   Leo GrosserMegan A Torey Regan 02/09/2018, 9:24 PM

## 2018-02-09 NOTE — [facility] Group Notes (Signed)
  [facility]  Date/Time: 02/09/18, 1315  Type of Therapy/Topic:  Group Therapy:  Emotion Regulation  Participation Level:  Active   Mood: pleasant  Description of Group:    The purpose of this group is to assist patients in learning to regulate negative emotions and experience positive emotions. Patients will be guided to discuss ways in which they have been vulnerable to their negative emotions. These vulnerabilities will be juxtaposed with experiences of positive emotions or situations, and patients challenged to use positive emotions to combat negative ones. Special emphasis will be placed on coping with negative emotions in conflict situations, and patients will process healthy conflict resolution skills.  Therapeutic Goals: 1. Patient will identify two positive emotions or experiences to reflect on in order to balance out negative emotions:  2. Patient will label two or more emotions that they find the most difficult to experience:  3. Patient will be able to demonstrate positive conflict resolution skills through discussion or role plays:   Summary of Patient Progress:Pt shared that anxiety is an emotion that is difficult for her to experience.  Pt was active at times in group discussion about positive ways to handle negative emotions, but also spent time with her eyes closed.        Therapeutic Modalities:   Cognitive Behavioral Therapy Feelings Identification Dialectical Behavioral Therapy  Daleen SquibbGreg Shyann Hefner, LCSW
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
## 2018-02-09 NOTE — Progress Notes (Signed)
CSW [facility] [facility] and asking CSW to call her at ext [phone].  CSW attempted to call twice on this date but office is closed today according to the message. Garner NashGregory Cambryn Charters, MSW, LCSW Clinical Social Worker 02/09/2018 3:15 PM

## 2018-02-09 NOTE — Progress Notes (Signed)
Recreation Therapy Notes  Date: 02/09/18 Time: 0930 Location: 300 Hall Dayroom  Group Topic: Stress Management  Goal Area(s) Addresses:  Patient will verbalize importance of using healthy stress management.  Patient will identify positive emotions associated with healthy stress management.   Intervention: Stress Management  Activity :  Mountain Meditation.  LRT played a meditation that explained the concept of taking on the characteristics of mountains (resilience, steady, immoveable) in daily life.  Patients were to follow along as the meditation was read to engage in the practice.  Education:  Stress Management, Discharge Planning.   Education Outcome: Acknowledges edcuation/In group clarification offered/Needs additional education  Clinical Observations/Feedback: Pt did not attend group.    Stephanie Petty, LRT/CTRS         Stephanie Petty A 02/09/2018 10:44 AM 

## 2018-02-09 NOTE — Progress Notes (Signed)
Adult Psychoeducational Group Note  Date:  02/09/2018 Time:  1000 Group Topic/Focus:  Identifying Needs:   The focus of this group is to help patients identify their personal needs that have been historically problematic and identify healthy behaviors to address their needs.  Participation Level:  Minimal  Participation Quality:  Attentive  Affect:  Appropriate  Cognitive:  Appropriate  Insight: Appropriate  Engagement in Group:  Engaged  Modes of Intervention:  Discussion, Rapport Building and Support  Additional Comments: group focus on vulnerability. Pt fall asleep in group discussion   Gwenevere Ghazili, Nitza Schmid Patience 02/09/2018, 2:01 PM

## 2018-02-09 NOTE — Progress Notes (Signed)
Adult Psychoeducational Group Note  Date:  02/09/2018 Time:  0830 Group Topic/Focus:  Goals Group:   The focus of this group is to help patients establish daily goals to achieve during treatment and discuss how the patient can incorporate goal setting into their daily lives to aide in recovery. Orientation:   The focus of this group is to educate the patient on the purpose and policies of crisis stabilization and provide a format to answer questions about their admission.  The group details unit policies and expectations of patients while admitted.  Participation Level:  Minimal  Participation Quality:  Appropriate and Attentive  Affect:  Appropriate  Cognitive:  Appropriate  Insight: Appropriate  Engagement in Group:  Engaged  Modes of Intervention:  Discussion, Rapport Building and Support  Additional Comments:  Pt appears to be attentive during group discussion   Gwenevere Ghazili, Kayla Deshaies Patience 02/09/2018, 10:05 AM

## 2018-02-10 NOTE — Progress Notes (Signed)
Patient ID: Stephanie Petty, female   DOB: 1981-04-23, 37 y.o.   MRN: [id number]  Pt currently presents with a flat affect and depressed behavior. Pt reports to writer that their goal is to "make a discharge plan to to counseling and see my psychiatrist." Pt states "I am a little anxious but I will sleep okay." Pt denies taking any sleep aids currently. Currently lying in bed attempting to fall asleep.   Pt's labs and vitals were monitored throughout the night. Pt given a 1:1 about emotional and mental status. Pt supported and encouraged to express concerns and questions.  Pt's safety ensured with 15 minute and environmental checks. Pt currently denies SI/HI and A/V hallucinations. Pt verbally agrees to seek staff if SI/HI or A/VH occurs and to consult with staff before acting on any harmful thoughts. Will continue POC.

## 2018-02-10 NOTE — Progress Notes (Signed)
Patient ID: Stephanie Petty, female   DOB: 11-07-1981, 37 y.o.   MRN: [id number]  D: Patient in dayroom on approach. Pt reports she is doing well. Pt mood and affect appeared anxious. Pt reports she is tolerating medication well. Pt reports her goal is to continue to stay positive. Pt thought process is organized and behavior is appropriate. Pt denies SI/HI/AVH and pain. Pt attended and participated in evening wrap up group. Cooperative with assessment. No acute distressed noted at this time.   A: Support and encouragement provided as needed. Pt encouraged to discuss feelings and come to staff with any question or concerns.   R: Patient remains safe on the unit.
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
## 2018-02-10 NOTE — Progress Notes (Signed)
D: Patient alert and oriented. Affect/mood: Anxious, cooperative. Denies SI, HI, AVH at this time. Denies pain. Goal: "to stay positive". Patient reports per patient self inventory sheet "good" appetite, concentration, and sleep, and "normal" energy level. Patient rates depression, hopelessness, and anxiety "3" (0-10). Patients goal for today was "stay positive". Expressed some feelings of anxiety related to discharging and returning home. Denies any physical complaints at this time.  A: Scheduled medications administered to patient per MD order. Support and encouragement provided. Routine safety checks conducted every 15 minutes. Patient informed to notify staff with problems or concerns.   R: No adverse drug reactions noted. Patient contracts for safety at this time. Patient compliant with medications and treatment plan. Patient receptive, calm, and cooperative, anxious at times. Has been observed present and receptive in groups on the unit. Patient interacts well with others on the unit. Patient remains safe at this time. Will continue to monitor.

## 2018-02-10 NOTE — Plan of Care (Signed)
Patient has remained safe and free from injury on the unit. Denies desire to harm self or others when asked. Encouraged to notify staff if feelings of harm toward self or others arise. Patient agrees. Will continue to monitor. 

## 2018-02-10 NOTE — [facility] Group Notes (Signed)
LCSW Group Therapy Note  02/10/2018   9:30-10:30am (300 hall)                10:30-11:30am (400 hall)                11:30am-12:00pm (500 hall)  Type of Therapy and Topic:  Group Therapy: Anger Cues and Responses  Participation Level:  Did Not Attend   Description of Group:   In this group, patients learned how to recognize the physical, cognitive, emotional, and behavioral responses they have to anger-provoking situations.  They identified a recent time they became angry and how they reacted.  They analyzed how their reaction was possibly beneficial and how it was possibly unhelpful.  The group discussed a variety of healthier coping skills that could help with such a situation in the future.  Deep breathing was practiced briefly.  Therapeutic Goals: 1. Patients will remember their last incident of anger and how they felt emotionally and physically, what their thoughts were at the time, and how they behaved. 2. Patients will identify how their behavior at that time worked for them, as well as how it worked against them. 3. Patients will explore possible new behaviors to use in future anger situations. 4. Patients will learn that anger itself is normal and cannot be eliminated, and that healthier reactions can assist with resolving conflict rather than worsening situations.  Summary of Patient Progress:  N/A  Therapeutic Modalities:   Cognitive Behavioral Therapy  Cristen Bredeson J Grossman-Orr  02/10/2018 12:00pm  

## 2018-02-11 MED ORDER — HYDROXYZINE HCL 25 MG PO TABS: 25.0000 mg | ORAL_TABLET | Freq: Four times a day (QID) | ORAL | 0 refills | Status: AC | PRN

## 2018-02-11 MED ORDER — ARIPIPRAZOLE 10 MG PO TABS: 10.0000 mg | ORAL_TABLET | Freq: Every day | ORAL | 0 refills | Status: AC

## 2018-02-11 MED ORDER — ESCITALOPRAM OXALATE 10 MG PO TABS: 10.0000 mg | ORAL_TABLET | Freq: Every day | ORAL | 0 refills | Status: AC

## 2018-02-11 MED ORDER — TRAZODONE HCL 50 MG PO TABS: 50.0000 mg | ORAL_TABLET | Freq: Every evening | ORAL | 0 refills | Status: AC | PRN

## 2018-02-11 NOTE — [facility] Group Notes (Signed)
Orientation / Goals   Date:  02/11/2018  Time:  9:21 AM  Type of Therapy:  Nurse Education /.  The group focuses on teaching patients who their staff is, what their staff's  responsibilities are and what the unit programming looks like.  Participation Level:  Active  Participation Quality:  Attentive  Affect:  Appropriate  Cognitive:  Alert  Insight:  Appropriate  Engagement in Group:  Engaged  Modes of Intervention:  Education  Summary of Progress/Problems:  Rich BraveDuke, Marjan Rosman Lynn 02/11/2018, 9:21 AM

## 2018-02-11 NOTE — Progress Notes (Signed)
D patient is observed sitting in the dayroom this morning. SHe has a blanket wrapped around her shoulders and it drags the floor as she ambulates, she says " I'm kiind of worried about today...ibuprofen;m not sure I want to go home". A SHe completed her daily assessment andnon this she wrote she denied SI today and she rated her depression, hopelessness and  Anxiety " 3/3/3/", respectively. She is identified by NP early this am as being discharged later today. Pt spoke with NP / MD and decision made to cancel discharge for today, due to patient's intense feelings of anxiety. DC cancelled and willcont to work with pt on devloping copig skills to deal with anxiety. R Safety si in place.

## 2018-02-11 NOTE — [facility] Group Notes (Signed)
Pt did not attend wrap up group this evening. Pt laid in bed instead.  

## 2018-02-11 NOTE — Progress Notes (Signed)
  [facility] :  Will you be returning to the same living situation after discharge:  Yes,  with children At discharge, do you have transportation home?: Yes,  mother Do you have the ability to pay for your medications: Yes,  states there are no barriers  Release of information consent forms completed and turned in to Medical Records by CSW.   Patient to Follow up at: Follow[facility] Information    [facility].   Why:  Please attend your medication appt with [name], FNP, on Wednesday, 02/14/18, at 8:00am. [facility] about scheduling a therapy appt with one of the therapists. Contact information: [address] [place], [place] [address] P: [phone] F: [phone]          Next level of care provider [facility] Link:no  Safety Planning and Suicide Prevention discussed: Yes,  with mother  Have you used any form of tobacco in the last 30 days? (Cigarettes, Smokeless Tobacco, Cigars, and/or Pipes): Yes  Has patient been referred to the Quitline?: Patient refused referral  Patient has been referred for addiction treatment: N/A  Lynnell ChadMareida J Grossman-Orr, LCSW 02/11/2018, 11:42 AM
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
## 2018-02-11 NOTE — Progress Notes (Signed)
D     Pt has isolated to her room this evening    She endorses depression and  Anxiety   And said she is not ready to be discharged yet  A    Verbal support given    Medications administered and effectiveness monitored    Q 15 min checks R   Pt is safe at present time

## 2018-02-11 NOTE — [facility] Group Notes (Signed)
[facility] LCSW Group Therapy Note  Date/Time:  02/11/2018  10:00AM-11:00AM  Type of Therapy and Topic:  Group Therapy:  Music and Mood  Participation Level:  Minimal   Description of Group: In this process group, members listened to a variety of genres of music and identified that different types of music evoke different responses.  Patients were encouraged to identify music that was soothing for them and music that was energizing for them.  Patients discussed how this knowledge can help with wellness and recovery in various ways including managing depression and anxiety as well as encouraging healthy sleep habits.    Therapeutic Goals: 1. Patients will explore the impact of different varieties of music on mood 2. Patients will verbalize the thoughts they have when listening to different types of music 3. Patients will identify music that is soothing to them as well as music that is energizing to them 4. Patients will discuss how to use this knowledge to assist in maintaining wellness and recovery 5. Patients will explore the use of music as a coping skill  Summary of Patient Progress:  When she arrived for the last 1/3 portion of group, patient expressed that she felt "fine," did not participate, and felt "fine" at the end of group.  Therapeutic Modalities: Solution Focused Brief Therapy Activity   Ambrose MantleMareida Grossman-Orr, LCSW

## 2018-02-12 MED ORDER — ARIPIPRAZOLE 15 MG PO TABS
15.0000 mg | ORAL_TABLET | Freq: Every day | ORAL | Status: DC
  Administered 2018-02-13: 15 mg via ORAL
  Filled 2018-02-12 (×3): qty 1

## 2018-02-12 MED ORDER — ESCITALOPRAM OXALATE 20 MG PO TABS
20.0000 mg | ORAL_TABLET | Freq: Every day | ORAL | Status: DC
  Administered 2018-02-13: 20 mg via ORAL
  Filled 2018-02-12 (×2): qty 1
  Filled 2018-02-12: qty 2

## 2018-02-12 MED ORDER — ESCITALOPRAM OXALATE 10 MG PO TABS
10.0000 mg | ORAL_TABLET | Freq: Once | ORAL | Status: AC
  Administered 2018-02-12: 10 mg via ORAL
  Filled 2018-02-12 (×2): qty 1

## 2018-02-12 NOTE — [facility] Group Notes (Signed)
Adult Psychoeducational Group Note  Date:  02/12/2018 Time:  8:58 PM  Group Topic/Focus:  Wrap-Up Group:   The focus of this group is to help patients review their daily goal of treatment and discuss progress on daily workbooks.  Participation Level:  Active  Participation Quality:  Appropriate and Attentive  Affect:  Appropriate  Cognitive:  Alert and Appropriate  Insight: Appropriate and Good  Engagement in Group:  Engaged  Modes of Intervention:  Discussion and Education  Additional Comments:  Pt attended and participated in wrap up group this evening. Pt had a good day due to them getting a discharge date for Tuesday and having their meds increased. Pt goal goal was to talk about their discharge plan with the Dr. A positive noted by the pt was that they spoke with their kids.   Chrisandra NettersOctavia A Sally Reimers 02/12/2018, 8:58 PM

## 2018-02-12 NOTE — Progress Notes (Signed)
D:Pt is sad and isolative. She reports passive si thoughts and says that she is sad due to her husband taking the kids away until "I can get my shit together." Pt is also anxious about returning to work as a [employer] that she reports is very stressful.  A:Offered support, encouragement and 15 minute checks. R:Pt contracts with staff for safety. Safety maintained on the unit.

## 2018-02-12 NOTE — Progress Notes (Signed)
Patient ID: Stephanie Petty, female   DOB: Jan 27, 1981, [age] y.o.   MRN: [id number]  Patient expressed passive SI to [facility], Para MarchJeanette informed Clinical research associatewriter who is Press photographercharge nurse today.

## 2018-02-12 NOTE — [facility] Group Notes (Signed)
[facility] LCSW Group Therapy Note  Date/Time: 02/12/18, 1315  Type of Therapy and Topic:  Group Therapy:  Overcoming Obstacles  Participation Level:  active  Description of Group:    In this group patients will be encouraged to explore what they see as obstacles to their own wellness and recovery. They will be guided to discuss their thoughts, feelings, and behaviors related to these obstacles. The group will process together ways to cope with barriers, with attention given to specific choices patients can make. Each patient will be challenged to identify changes they are motivated to make in order to overcome their obstacles. This group will be process-oriented, with patients participating in exploration of their own experiences as well as giving and receiving support and challenge from other group members.  Therapeutic Goals: 1. Patient will identify personal and current obstacles as they relate to admission. 2. Patient will identify barriers that currently interfere with their wellness or overcoming obstacles.  3. Patient will identify feelings, thought process and behaviors related to these barriers. 4. Patient will identify two changes they are willing to make to overcome these obstacles:    Summary of Patient Progress: Pt came to group halfway through, identified depression as her biggest obstacle.  Pt participated in group discussion during the time she was there.        Therapeutic Modalities:   Cognitive Behavioral Therapy Solution Focused Therapy Motivational Interviewing Relapse Prevention Therapy  Daleen SquibbGreg Massiel Stipp, LCSW

## 2018-02-12 NOTE — Progress Notes (Signed)
Recreation Therapy Notes   Date:3.11.19 Time:9:30 a.m. Location: 300 Hall Dayroom  Group Topic: Stress Management  Goal Area(s) Addresses:  Goal 1.1: To reduce stress  -Patient will report feeling a reduction in stress level  -Patient will identify the importance of stress management  -Patient will participate during stress management group treatment    Behavioral Response:Patient did not attend    Alycen Mack, Recreation Therapy Intern   Saidah Kempton 02/12/2018 11:13 AM 

## 2018-02-12 NOTE — Plan of Care (Signed)
Pt is taking medications as prescribed without complaint 
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
## 2018-02-13 MED ORDER — ESCITALOPRAM OXALATE 10 MG PO TABS
10.0000 mg | ORAL_TABLET | Freq: Every day | ORAL | Status: DC
  Administered 2018-02-14 – 2018-02-15 (×2): 10 mg via ORAL
  Filled 2018-02-13 (×4): qty 1

## 2018-02-13 MED ORDER — PROPRANOLOL HCL 20 MG PO TABS
20.0000 mg | ORAL_TABLET | Freq: Three times a day (TID) | ORAL | Status: DC
  Filled 2018-02-13 (×6): qty 1

## 2018-02-13 MED ORDER — ARIPIPRAZOLE 10 MG PO TABS
10.0000 mg | ORAL_TABLET | Freq: Every day | ORAL | Status: DC
  Administered 2018-02-14 – 2018-02-15 (×2): 10 mg via ORAL
  Filled 2018-02-13 (×4): qty 1

## 2018-02-13 MED ORDER — LORAZEPAM 0.5 MG PO TABS
0.5000 mg | ORAL_TABLET | Freq: Four times a day (QID) | ORAL | Status: DC | PRN
  Administered 2018-02-13: 0.5 mg via ORAL
  Filled 2018-02-13: qty 1

## 2018-02-13 NOTE — Progress Notes (Signed)
D: Patient denies SI, HI or AVH. Patient presents as flat and anxious, stating that these symptoms have increased since her Abilify and Lexapro dosages were increased yesterday.  Pt. Otherwise reports a good appetite and is sleeping well.  She is attending groups and is interacting with staff and others on the unit.   A: Patient given emotional support from RN. Patient encouraged to come to staff with concerns and/or questions. Patient's medication routine continued. Patient's orders and plan of care reviewed.   R: Patient remains appropriate and cooperative. Will continue to monitor patient q15 minutes for safety.

## 2018-02-13 NOTE — Progress Notes (Signed)
Pt reports her day has been ok, but she is still depressed and worried about her kids and the situation with her ex-husband.  She says she has passive thoughts to kill herself, but she contracts for safety on the unit.  She denies HI/AVH.  She went to evening wrap up group and participated.  She spent some time in the dayroom instead of isolating to her room all evening. Pt declined a sleep aid, stating she didn't need one.  Support and encouragement offered.  Discharge plans are in process.  Safety maintained with q15 minute checks.
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
## 2018-02-13 NOTE — Progress Notes (Signed)
Adult Psychoeducational Group Note  Date:  02/13/2018 Time:  9:39 AM  Group Topic/Focus:  Orientation:   The focus of this group is to educate the patient on the purpose and policies of crisis stabilization and provide a format to answer questions about their admission.  The group details unit policies and expectations of patients while admitted.  Participation Level:  Active  Participation Quality:  Appropriate  Affect:  Appropriate  Cognitive:  Alert  Insight: Appropriate  Engagement in Group:  Engaged  Modes of Intervention:  Discussion and Education  Additional Comments:   Pt participated in group. Pt's goal today is to have a positive discharge. Pt said while she has been here she has learned how to deal with things differently.   Karren CobbleFizah G Arjen Deringer 02/13/2018, 9:39 AM

## 2018-02-13 NOTE — [facility] Group Notes (Signed)
Pt did not attend wrap up group this evening. Pt laid in bed instead.  

## 2018-02-13 NOTE — [facility] Group Notes (Addendum)
LCSW Group Therapy Note 02/13/2018 12:36 PM  Type of Therapy/Topic: Group Therapy: Feelings about Diagnosis  Participation Level: Minimal   Description of Group:  This group will allow patients to explore their thoughts and feelings about diagnoses they have received. Patients will be guided to explore their level of understanding and acceptance of these diagnoses. Facilitator will encourage patients to process their thoughts and feelings about the reactions of others to their diagnosis and will guide patients in identifying ways to discuss their diagnosis with significant others in their lives. This group will be process-oriented, with patients participating in exploration of their own experiences, giving and receiving support, and processing challenge from other group members.  Therapeutic Goals: 1. Patient will demonstrate understanding of diagnosis as evidenced by identifying two or more symptoms of the disorder 2. Patient will be able to express two feelings regarding the diagnosis 3. Patient will demonstrate their ability to communicate their needs through discussion and/or role play  Summary of Patient Progress:  Morrie Sheldonshley was engaged and participated during her stay in today's group session. Morrie Sheldonshley came late, however contributed and stated that she plan to find positivity in any situation.     Therapeutic Modalities:  Cognitive Behavioral Therapy Brief Therapy Feelings Identification    Sheryll Dymek Catalina AntiguaWilliams LCSWA Clinical Social Worker

## 2018-02-14 MED ORDER — HYDROXYZINE HCL 25 MG PO TABS: 25.0000 mg | ORAL_TABLET | Freq: Four times a day (QID) | ORAL | Status: DC | PRN

## 2018-02-14 MED ORDER — LORAZEPAM 0.5 MG PO TABS: 0.5000 mg | ORAL_TABLET | Freq: Four times a day (QID) | ORAL | Status: DC | PRN

## 2018-02-14 NOTE — Progress Notes (Signed)
Pt presents with a flat affect and depressed mood. Pt noted to have minimal interaction on the milieu. Pt expressed to writer that she had shaking hands yesterday after taking a higher dose of Abilify and wanted to make sure that she was taking a lower dose this morning. Pt denies any hand shaking today. Pt compliant with med regimen and denies any side effects.. Orders reviewed with pt. Verbal support provided. Pt encouraged to attend groups. 15 minute checks performed for safety.

## 2018-02-14 NOTE — Progress Notes (Signed)
Patient did not attend wrap up group. 

## 2018-02-14 NOTE — [facility] Group Notes (Signed)
[facility] [employer] Group Therapy 02/14/2018 1:15pm  Type of Therapy: [employer] Presentation  Participation Level: Invited. DID NOT ATTEND. Pt chose to remain in bed.   Summary of Progress/Problems: [employer] ([facility]) Speaker came to talk about his personal journey with mental health. The pt processed ways by which to relate to the speaker. [facility] speaker provided handouts and educational information pertaining to groups and services offered by the [employer]. Pt was engaged in speaker's presentation and was receptive to resources provided.    Sohrab Keelan N Smart, LCSW 02/14/2018 9:17 AM  

## 2018-02-14 NOTE — Plan of Care (Signed)
  Safety: Periods of time without injury will increase 02/14/2018 0106 - Progressing by Delos HaringPhillips, Yailen Zemaitis A, RN Note Pt safe on the unit
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
## 2018-02-14 NOTE — Progress Notes (Signed)
Adult Psychoeducational Group Note  Date:  02/14/2018 Time:  7:15 PM  Group Topic/Focus:  Building Self Esteem:   The Focus of this group is helping patients become aware of the effects of self-esteem on their lives, the things they and others do that enhance or undermine their self-esteem, seeing the relationship between their level of self-esteem and the choices they make and learning ways to enhance self-esteem.  Participation Level:  Active  Participation Quality:  Appropriate  Affect:  Appropriate  Cognitive:  Alert and Appropriate  Insight: Appropriate, Good and Improving  Engagement in Group:  Engaged  Modes of Intervention:  Activity and Discussion  Additional Comments:  Pt attended group and participated in activity and discussion.  Lorie Melichar R Marshia Tropea 02/14/2018, 7:15 PM

## 2018-02-14 NOTE — Progress Notes (Signed)
Pt has been in bed all evening.  She states she is just feeling tired and decided to go to bed early.  She denies SI/HI/AVH.  She reports that she is supposed to discharge tomorrow.  She says she is ready to go and feels good with her plan for discharge.  Pt was encouraged to make her needs known to staff.  Support and encouragement offered.  Discharge plans are in process.  Safety maintained with q15 minute checks.

## 2018-02-14 NOTE — [facility] Group Notes (Signed)
[facility] Group Notes:  (Nursing/MHT/Case Management/Adjunct)  Date:  02/14/2018  Time:  5:42 PM  Type of Therapy:  Nurse Education  Participation Level:  Did Not Attend   Summary of Progress/Problems:  This group discussed the integral connectedness between the mind, body and the emotions and how ones body posture can significantly impact ones out look and emotions. Power stances were practiced and emotions were assessed before and after.  Stephanie Petty 02/14/2018, 5:42 PM

## 2018-02-14 NOTE — Progress Notes (Signed)
Recreation Therapy Notes  Date: 3.13.19 Time: 9:30 a.m. Location: [address] Dayroom  Group Topic: Stress Management  Goal Area(s) Addresses:  Goal 1.1: To reduce stress  -Patient will report feeling a reduction in stress level  -Patient will identify the importance of stress management  -Patient will participate during stress management group treatment    Intervention: Stress Management  Activity: Meditattion- Recreation Therapy Intern played a guided meditation video. Patients were in a peaceful environment with soft lighting enhancing patients mood.   Education: Stress Management, Discharge Planning.   Education Outcome: Acknowledges edcuation/In group clarification offered/Needs additional education  Clinical Observations/Feedback:: Patient did not attend    Sheryle HailDarian Astor Gentle, Recreation Therapy Intern   Sheryle HailDarian Eneida Evers 02/14/2018 8:09 AM

## 2018-02-14 NOTE — Progress Notes (Signed)
D: Pt denies SI/HI/AVH. Pt is pleasant and cooperative. Pt stated she felt the same, pt seen on the unit talking with peers and appeared to be in no distress  A: Pt was offered support and encouragement. Pt was given scheduled medications. Pt was encourage to attend groups. Q 15 minute checks were done for safety.   R:Pt attends groups and interacts well with peers and staff. Pt is taking medication. Pt has no complaints.Pt receptive to treatment and safety maintained on unit.

## 2018-02-15 NOTE — Progress Notes (Signed)
Pt did not attend morning orientation/goals group. 

## 2018-02-15 NOTE — Progress Notes (Signed)
Pt received both written and verbal discharge instructions. Pt verbalized understanding of discharge instructions. Pt agreed to f/u appt and med regimen. Pt received d/c packet and prescriptions. Pt gathered belongings from room and locker.  

## 2018-02-15 NOTE — Progress Notes (Signed)
  [facility]  Will you be returning to the same living situation after discharge:  Yes,  own home At discharge, do you have transportation home?: Yes,  mom Do you have the ability to pay for your medications: Yes,  BCBS  Release of information consent forms completed and in the chart;  Patient's signature needed at discharge.  Patient to Follow up at: Follow-up Information    [facility].   Why:  Medication appt with [name], FNP, on Tuesday, 02/20/18, at 11:00am.  Contact information: [address] [place], [place] [address] P: [phone] F: [phone]       [facility].   Why:  Therapy appointment is Tuesday, 02/20/18, at 1:00pm with [name]. Contact information: [address] [place], [place] [address] P: [phone] F: [phone]          Next level of care provider [facility]:no  Safety Planning and Suicide Prevention discussed: Yes,  mother  Have you used any form of tobacco in the last 30 days? (Cigarettes, Smokeless Tobacco, Cigars, and/or Pipes): Yes  Has patient been referred to the Quitline?: Patient refused referral  Patient has been referred for addiction treatment: Yes  Lorri FrederickWierda, Stephanie Snowball Jon, LCSW 02/15/2018, 9:58 AM

## 2018-02-15 NOTE — Tx Team (Signed)
Interdisciplinary Treatment and Diagnostic Plan Update  02/14/18 Time of Session: 1000 Stephanie Petty MRN: [id number]  Principal Diagnosis: MDD (major depressive disorder), recurrent episode, severe (HCC)  Secondary Diagnoses: Principal Problem:   MDD (major depressive disorder), recurrent episode, severe (HCC) Active Problems:   Sinus congestion   Current Medications:  Current Facility-Administered Medications  Medication Dose Route Frequency Provider Last Rate Last Dose  . acetaminophen (TYLENOL) tablet 650 mg  650 mg Oral Q6H PRN Thermon Leylandavis, Laura A, NP      . alum & mag hydroxide-simeth (MAALOX/MYLANTA) 200-200-20 MG/5ML suspension 30 mL  30 mL Oral Q4H PRN Fransisca Kaufmannavis, Laura A, NP      . ARIPiprazole (ABILIFY) tablet 10 mg  10 mg Oral Daily Money, Gerlene Burdockravis B, FNP   10 mg at 02/15/18 [id number]  . escitalopram (LEXAPRO) tablet 10 mg  10 mg Oral Daily Money, Gerlene Burdockravis B, FNP   10 mg at 02/15/18 0803  . fluticasone (FLONASE) 50 MCG/ACT nasal spray 2 spray  2 spray Each Nare BID Money, Gerlene Burdockravis B, FNP   2 spray at 02/07/18 1700  . hydrOXYzine (ATARAX/VISTARIL) tablet 25 mg  25 mg Oral Q6H PRN Cobos, Rockey SituFernando A, MD      . LORazepam (ATIVAN) tablet 0.5 mg  0.5 mg Oral Q6H PRN Cobos, Fernando A, MD      . magnesium hydroxide (MILK OF MAGNESIA) suspension 30 mL  30 mL Oral Daily PRN Fransisca Kaufmannavis, Laura A, NP      . nicotine polacrilex (NICORETTE) gum 2 mg  2 mg Oral PRN Georgiann CockerIzediuno, Vincent A, MD   2 mg at 02/05/18 1452  . traZODone (DESYREL) tablet 50 mg  50 mg Oral QHS PRN Thermon Leylandavis, Laura A, NP       PTA Medications: Medications Prior to Admission  Medication Sig Dispense Refill Last Dose  . busPIRone (BUSPAR) 10 MG tablet Take 10 mg by mouth daily.   Past Week at Unknown time  . risperiDONE (RISPERDAL) 2 MG tablet Take 2 mg by mouth at bedtime.   Past Week at Unknown time    Patient Stressors: Marital or family conflict Medication change or noncompliance  Patient Strengths: Ability for insight Average or above  average intelligence Capable of independent living Communication skills General fund of knowledge Motivation for treatment/growth  Treatment Modalities: Medication Management, Group therapy, Case management,  1 to 1 session with clinician, Psychoeducation, Recreational therapy.   Physician Treatment Plan for Primary Diagnosis: MDD (major depressive disorder), recurrent episode, severe (HCC) Long Term Goal(s): Improvement in symptoms so as ready for discharge Improvement in symptoms so as ready for discharge   Short Term Goals: Ability to identify changes in lifestyle to reduce recurrence of condition will improve Ability to maintain clinical measurements within normal limits will improve Ability to identify changes in lifestyle to reduce recurrence of condition will improve Ability to maintain clinical measurements within normal limits will improve  Medication Management: Evaluate patient's response, side effects, and tolerance of medication regimen.  Therapeutic Interventions: 1 to 1 sessions, Unit Group sessions and Medication administration.  Evaluation of Outcomes: Progressing  Physician Treatment Plan for Secondary Diagnosis: Principal Problem:   MDD (major depressive disorder), recurrent episode, severe (HCC) Active Problems:   Sinus congestion  Long Term Goal(s): Improvement in symptoms so as ready for discharge Improvement in symptoms so as ready for discharge   Short Term Goals: Ability to identify changes in lifestyle to reduce recurrence of condition will improve Ability to maintain clinical measurements within normal limits will  improve Ability to identify changes in lifestyle to reduce recurrence of condition will improve Ability to maintain clinical measurements within normal limits will improve     Medication Management: Evaluate patient's response, side effects, and tolerance of medication regimen.  Therapeutic Interventions: 1 to 1 sessions, Unit Group  sessions and Medication administration.  Evaluation of Outcomes: Progressing   RN Treatment Plan for Primary Diagnosis: MDD (major depressive disorder), recurrent episode, severe (HCC) Long Term Goal(s): Knowledge of disease and therapeutic regimen to maintain health will improve  Short Term Goals: Ability to identify and develop effective coping behaviors will improve and Compliance with prescribed medications will improve  Medication Management: RN will administer medications as ordered by provider, will assess and evaluate patient's response and provide education to patient for prescribed medication. RN will report any adverse and/or side effects to prescribing provider.  Therapeutic Interventions: 1 on 1 counseling sessions, Psychoeducation, Medication administration, Evaluate responses to treatment, Monitor vital signs and CBGs as ordered, Perform/monitor CIWA, COWS, AIMS and Fall Risk screenings as ordered, Perform wound care treatments as ordered.  Evaluation of Outcomes: Progressing   LCSW Treatment Plan for Primary Diagnosis: MDD (major depressive disorder), recurrent episode, severe (HCC) Long Term Goal(s): Safe transition to appropriate next level of care at discharge, Engage patient in therapeutic group addressing interpersonal concerns.  Short Term Goals: Engage patient in aftercare planning with referrals and resources, Increase social support and Increase skills for wellness and recovery  Therapeutic Interventions: Assess for all discharge needs, 1 to 1 time with Social worker, Explore available resources and support systems, Assess for adequacy in community support network, Educate family and significant other(s) on suicide prevention, Complete Psychosocial Assessment, Interpersonal group therapy.  Evaluation of Outcomes: Progressing   Progress in Treatment: Attending groups: Yes. Participating in groups: Yes. Taking medication as prescribed: Yes. Toleration medication:  Yes. Family/Significant other contact made: Yes, individual(s) contacted:  mother Patient understands diagnosis: Yes. Discussing patient identified problems/goals with staff: Yes. Medical problems stabilized or resolved: Yes. Denies suicidal/homicidal ideation: Yes. Issues/concerns per patient self-inventory: No. Other: none  New problem(s) identified: No, Describe:  none  New Short Term/Long Term Goal(s):Pt goal: "get stable and feel better."  Discharge Plan or Barriers:   Reason for Continuation of Hospitalization: Depression Medication stabilization  Estimated Length of Stay: 1 day.  Attendees: Patient:   Physician: Dr Jackquline Berlin, MD 02/14/18  Nursing: Liborio Nixon, RN 02/14/18  RN Care Manager:   Social Worker: Daleen Squibb, LCSW 02/14/18  Recreational Therapist:    Other: Enid Cutter, intern 02/14/18  Other:    Other:           Scribe for Treatment Team: Lorri Frederick, LCSW 02/15/2018 8:41 AM
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
## 2023-04-19 ENCOUNTER — Telehealth: Payer: Self-pay

## 2023-04-19 ENCOUNTER — Other Ambulatory Visit (HOSPITAL_COMMUNITY): Payer: Self-pay

## 2023-04-19 NOTE — Telephone Encounter (Signed)
PA request received via CMM for EPINEPHrine 0.3MG /0.3ML auto-injectors  PA not submitted due to medications being covered   Pharmacy must use certain NDC's

## 2024-08-01 ENCOUNTER — Emergency Department (HOSPITAL_BASED_OUTPATIENT_CLINIC_OR_DEPARTMENT_OTHER)

## 2024-08-01 ENCOUNTER — Emergency Department (HOSPITAL_BASED_OUTPATIENT_CLINIC_OR_DEPARTMENT_OTHER)
Admission: EM | Admit: 2024-08-01 | Discharge: 2024-08-01 | Disposition: A | Discharge status: Home / Self Care | Attending: Emergency Medicine | Admitting: Emergency Medicine

## 2024-08-01 ENCOUNTER — Encounter (HOSPITAL_BASED_OUTPATIENT_CLINIC_OR_DEPARTMENT_OTHER): Payer: Self-pay | Admitting: Emergency Medicine

## 2024-08-01 ENCOUNTER — Other Ambulatory Visit: Payer: Self-pay

## 2024-08-01 DIAGNOSIS — F17210 Nicotine dependence, cigarettes, uncomplicated: Secondary | ICD-10-CM | POA: Diagnosis not present

## 2024-08-01 DIAGNOSIS — Y92410 Unspecified street and highway as the place of occurrence of the external cause: Secondary | ICD-10-CM | POA: Diagnosis not present

## 2024-08-01 DIAGNOSIS — N189 Chronic kidney disease, unspecified: Secondary | ICD-10-CM | POA: Diagnosis not present

## 2024-08-01 DIAGNOSIS — R1030 Lower abdominal pain, unspecified: Secondary | ICD-10-CM | POA: Diagnosis not present

## 2024-08-01 DIAGNOSIS — M545 Low back pain, unspecified: Secondary | ICD-10-CM | POA: Insufficient documentation

## 2024-08-01 LAB — COMPREHENSIVE METABOLIC PANEL WITH GFR
ALT: 10 U/L (ref 0–44)
AST: 17 U/L (ref 15–41)
Albumin: 4.3 g/dL (ref 3.5–5.0)
Alkaline Phosphatase: 29 U/L — ABNORMAL LOW (ref 38–126)
Anion gap: 10 (ref 5–15)
BUN: 20 mg/dL (ref 6–20)
CO2: 25 mmol/L (ref 22–32)
Calcium: 9.1 mg/dL (ref 8.9–10.3)
Chloride: 103 mmol/L (ref 98–111)
Creatinine, Ser: 0.85 mg/dL (ref 0.44–1.00)
GFR, Estimated: >60 mL/min (ref >60)
Glucose, Bld: 95 mg/dL (ref 70–99)
Potassium: 4 mmol/L (ref 3.5–5.1)
Sodium: 138 mmol/L (ref 135–145)
Total Bilirubin: 0.3 mg/dL (ref 0.0–1.2)
Total Protein: 6.3 g/dL — ABNORMAL LOW (ref 6.5–8.1)

## 2024-08-01 LAB — URINALYSIS, ROUTINE W REFLEX MICROSCOPIC
Bilirubin Urine: NEGATIVE
Glucose, UA: NEGATIVE mg/dL
Hgb urine dipstick: NEGATIVE
Ketones, ur: NEGATIVE mg/dL
Leukocytes,Ua: NEGATIVE
Nitrite: NEGATIVE
Protein, ur: NEGATIVE mg/dL
Specific Gravity, Urine: <=1.005 (ref 1.005–1.030)
pH: 6 (ref 5.0–8.0)

## 2024-08-01 LAB — CBC
HCT: 38.9 % (ref 36.0–46.0)
Hemoglobin: 13.3 g/dL (ref 12.0–15.0)
MCH: 31 pg (ref 26.0–34.0)
MCHC: 34.2 g/dL (ref 30.0–36.0)
MCV: 90.7 fL (ref 80.0–100.0)
Platelets: 334 K/uL (ref 150–400)
RBC: 4.29 MIL/uL (ref 3.87–5.11)
RDW: 13.3 % (ref 11.5–15.5)
WBC: 9.3 K/uL (ref 4.0–10.5)
nRBC: 0 % (ref 0.0–0.2)

## 2024-08-01 LAB — HCG, SERUM, QUALITATIVE: Preg, Serum: NEGATIVE

## 2024-08-01 MED ORDER — LIDOCAINE 5 % EX PTCH: 1.0000 | MEDICATED_PATCH | CUTANEOUS | 0 refills | Status: AC

## 2024-08-01 MED ORDER — IBUPROFEN 400 MG PO TABS
600.0000 mg | ORAL_TABLET | Freq: Once | ORAL | Status: AC
  Administered 2024-08-01: 600 mg via ORAL
  Filled 2024-08-01: qty 1

## 2024-08-01 MED ORDER — METHOCARBAMOL 500 MG PO TABS: 500.0000 mg | ORAL_TABLET | Freq: Two times a day (BID) | ORAL | 0 refills | Status: AC | PRN

## 2024-08-01 MED ORDER — IBUPROFEN 600 MG PO TABS: 600.0000 mg | ORAL_TABLET | Freq: Four times a day (QID) | ORAL | 0 refills | Status: AC | PRN

## 2024-08-01 MED ORDER — IOHEXOL 300 MG/ML  SOLN
75.0000 mL | Freq: Once | INTRAMUSCULAR | Status: AC | PRN
  Administered 2024-08-01: 80 mL via INTRAVENOUS

## 2024-08-01 MED ORDER — ACETAMINOPHEN 500 MG PO TABS
1000.0000 mg | ORAL_TABLET | Freq: Once | ORAL | Status: AC
  Administered 2024-08-01: 1000 mg via ORAL
  Filled 2024-08-01: qty 2

## 2024-08-01 MED ORDER — LIDOCAINE 5 % EX PTCH
1.0000 | MEDICATED_PATCH | CUTANEOUS | Status: DC
  Administered 2024-08-01: 1 via TRANSDERMAL
  Filled 2024-08-01: qty 1
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
## 2024-08-12 ENCOUNTER — Encounter: Payer: Self-pay | Admitting: Sports Medicine

## 2024-08-12 ENCOUNTER — Ambulatory Visit (INDEPENDENT_AMBULATORY_CARE_PROVIDER_SITE_OTHER): Payer: Self-pay | Admitting: Sports Medicine

## 2024-08-12 VITALS — BP 114/80 | Ht 63.5 in | Wt 183.0 lb

## 2024-08-12 DIAGNOSIS — S39012A Strain of muscle, fascia and tendon of lower back, initial encounter: Secondary | ICD-10-CM
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
## 2024-09-09 ENCOUNTER — Encounter: Payer: Self-pay | Admitting: Sports Medicine

## 2024-11-06 ENCOUNTER — Encounter

## 2024-11-06 NOTE — Progress Notes (Signed)
 Erroneous encounter due to cancellation or no-show. Patient was not seen.

## 2024-11-11 ENCOUNTER — Telehealth (HOSPITAL_BASED_OUTPATIENT_CLINIC_OR_DEPARTMENT_OTHER): Payer: Self-pay

## 2024-11-11 ENCOUNTER — Ambulatory Visit

## 2024-11-11 VITALS — BP 120/82 | Ht 63.5 in | Wt 183.0 lb

## 2024-11-11 DIAGNOSIS — S39012D Strain of muscle, fascia and tendon of lower back, subsequent encounter: Secondary | ICD-10-CM | POA: Diagnosis not present
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
